# Patient Record
Sex: Male | Born: 1985 | Race: Black or African American | Hispanic: No | Marital: Single | Smoking: Never smoker
Health system: Southern US, Community
[De-identification: ages and names within clinical notes are randomized; demographics above are authoritative.]

## PROBLEM LIST (undated history)

## (undated) DIAGNOSIS — J329 Chronic sinusitis, unspecified: Secondary | ICD-10-CM

## (undated) DIAGNOSIS — F419 Anxiety disorder, unspecified: Secondary | ICD-10-CM

---

## 2010-02-24 ENCOUNTER — Emergency Department (HOSPITAL_COMMUNITY)
Admission: EM | Admit: 2010-02-24 | Discharge: 2010-02-24 | Payer: Self-pay | Source: Home / Self Care | Admitting: Emergency Medicine

## 2011-04-26 ENCOUNTER — Encounter (HOSPITAL_COMMUNITY): Payer: Self-pay | Admitting: Emergency Medicine

## 2011-04-26 ENCOUNTER — Emergency Department (HOSPITAL_COMMUNITY)
Admission: EM | Admit: 2011-04-26 | Discharge: 2011-04-26 | Disposition: A | Discharge status: Home / Self Care | Payer: Self-pay | Attending: Emergency Medicine | Admitting: Emergency Medicine

## 2011-04-26 DIAGNOSIS — J029 Acute pharyngitis, unspecified: Secondary | ICD-10-CM | POA: Insufficient documentation

## 2011-04-26 LAB — RAPID STREP SCREEN (MED CTR MEBANE ONLY): Streptococcus, Group A Screen (Direct): NEGATIVE

## 2011-04-26 MED ORDER — AMOXICILLIN 500 MG PO CAPS: 500.0000 mg | ORAL_CAPSULE | Freq: Three times a day (TID) | ORAL | Status: AC

## 2011-04-26 NOTE — ED Provider Notes (Signed)
History     CSN: [id number]  Arrival date & time 04/26/11  1311   First MD Initiated Contact with Patient 04/26/11 1340      Chief Complaint  Patient presents with  . Oral Swelling    (Consider location/radiation/quality/duration/timing/severity/associated sxs/prior treatment) HPI History from patient. He states that he woke up this morning and felt as if his throat was swelling. He states that when he was eating breakfast, he felt as if he was choking and tried to make himself vomit. He is handling his secretions normally and is not in respiratory distress. Denies fever, chills, nausea, vomiting. Not had chest pain, shortness of breath, abdominal pain. No recent cough. He states that he has had a small amount of congestion, but states that he has allergic rhinitis and this is normal for him.  States he has had oral sex recently but partner does not have any known STIs.  History reviewed. No pertinent past medical history.  History reviewed. No pertinent past surgical history.  History reviewed. No pertinent family history.  History  Substance Use Topics  . Smoking status: Not on file  . Smokeless tobacco: Not on file  . Alcohol Use: 1.2 oz/week    2 Cans of beer per week     per week      Review of Systems as per history of present illness  Allergies  Review of patient's allergies indicates no known allergies.  Home Medications  No current outpatient prescriptions on file.  BP 135/68  Pulse 93  Temp(Src) 98.5 F (36.9 C) (Oral)  Resp 20  SpO2 95%  Physical Exam  Nursing note and vitals reviewed. Constitutional: He appears well-developed and well-nourished. No distress.  HENT:  Head: Normocephalic and atraumatic.  Nose: Nose normal.  Mouth/Throat: No oropharyngeal exudate.       Uvula mildly edematous without deviation. Tonsils 3+ bilaterally. Several small vesicles seen on the palate and one vesicle seen on R tonsil; no tonsillar exudate. No evidence for  peritonsillar abscess.  Eyes: Conjunctivae and EOM are normal. Pupils are equal, round, and reactive to light.  Neck: Normal range of motion. Neck supple.  Cardiovascular: Normal rate, regular rhythm and normal heart sounds.   Pulmonary/Chest: Effort normal and breath sounds normal.  Abdominal: Soft. There is no tenderness.  Lymphadenopathy:    He has no cervical adenopathy.  Neurological: He is alert.  Skin: Skin is warm and dry. He is not diaphoretic.       No rash noted to body, hands, feet  Psychiatric: He has a normal mood and affect.    ED Course  Procedures (including critical care time)   Labs Reviewed  RAPID STREP SCREEN   No results found.   1. Pharyngitis       MDM  Patient presents with pharyngitis and a sensation as if his throat is swelling. There is no evidence of this on exam. He does not have any stridor, is handling his secretions normally, and is protecting his airway. He is nontoxic-appearing and afebrile. No appreciable adenopathy on exam. No evidence for peritonsillar abscess although uvula appears mildly edematous. Rapid strep negative. It is possible this is a viral pharyngitis, but given his symptoms, will symptomatically treat. Discussed reasons to return to the ED. He verbalized understanding and agreed to plan.       Grant Fontana, [place] 04/26/11 1601

## 2011-04-26 NOTE — ED Notes (Signed)
Pt. Stated, i woke this am and my throat felt swollen and I have a little sore throat, . Pt. Can swallowing ok

## 2011-04-26 NOTE — Discharge Instructions (Signed)
Although your strep test was negative, we have treated you given your symptoms. Please take ALL of the antibiotic until it is gone. If you develop worsening swelling, trouble swallowing, high fever not controlled by medication, or any other worrisome symptoms, return to the ER.  Pharyngitis, Viral and Bacterial Pharyngitis is soreness (inflammation) or infection of the pharynx. It is also called a sore throat. CAUSES  Most sore throats are caused by viruses and are part of a cold. However, some sore throats are caused by strep and other bacteria. Sore throats can also be caused by post nasal drip from draining sinuses, allergies and sometimes from sleeping with an open mouth. Infectious sore throats can be spread from person to person by coughing, sneezing and sharing cups or eating utensils. TREATMENT  Sore throats that are viral usually last 3-4 days. Viral illness will get better without medications (antibiotics). Strep throat and other bacterial infections will usually begin to get better about 24-48 hours after you begin to take antibiotics. HOME CARE INSTRUCTIONS   If the caregiver feels there is a bacterial infection or if there is a positive strep test, they will prescribe an antibiotic. The full course of antibiotics must be taken. If the full course of antibiotic is not taken, you or your child may become ill again. If you or your child has strep throat and do not finish all of the medication, serious heart or kidney diseases may develop.   Drink enough water and fluids to keep your urine clear or pale yellow.   Only take over-the-counter or prescription medicines for pain, discomfort or fever as directed by your caregiver.   Get lots of rest.   Gargle with salt water ( tsp. of salt in a glass of water) as often as every 1-2 hours as you need for comfort.   Hard candies may soothe the throat if individual is not at risk for choking. Throat sprays or lozenges may also be used.  SEEK  MEDICAL CARE IF:   Large, tender lumps in the neck develop.   A rash develops.   Green, yellow-brown or bloody sputum is coughed up.   Your baby is older than 3 months with a rectal temperature of 100.5 F (38.1 C) or higher for more than 1 day.  SEEK IMMEDIATE MEDICAL CARE IF:   A stiff neck develops.   You or your child are drooling or unable to swallow liquids.   You or your child are vomiting, unable to keep medications or liquids down.   You or your child has severe pain, unrelieved with recommended medications.   You or your child are having difficulty breathing (not due to stuffy nose).   You or your child are unable to fully open your mouth.   You or your child develop redness, swelling, or severe pain anywhere on the neck.   You have a fever.   Your baby is older than 3 months with a rectal temperature of 102 F (38.9 C) or higher.   Your baby is 43 months old or younger with a rectal temperature of 100.4 F (38 C) or higher.  MAKE SURE YOU:   Understand these instructions.   Will watch your condition.   Will get help right away if you are not doing well or get worse.  Document Released: 01/13/2005 Document Revised: 01/02/2011 Document Reviewed: 04/12/2007 [facility] Patient Information 2012 [place], [place].

## 2011-05-02 NOTE — ED Provider Notes (Signed)
Medical screening examination/treatment/procedure(s) were performed by non-physician practitioner and as supervising physician I was immediately available for consultation/collaboration.   Gavin Pound. Oletta Lamas, MD 05/02/11 2018

## 2011-11-27 ENCOUNTER — Emergency Department (HOSPITAL_COMMUNITY)
Admission: EM | Admit: 2011-11-27 | Discharge: 2011-11-27 | Disposition: A | Discharge status: Home / Self Care | Payer: Self-pay | Attending: Emergency Medicine | Admitting: Emergency Medicine

## 2011-11-27 ENCOUNTER — Other Ambulatory Visit: Payer: Self-pay

## 2011-11-27 ENCOUNTER — Encounter (HOSPITAL_COMMUNITY): Payer: Self-pay

## 2011-11-27 DIAGNOSIS — R450 Nervousness: Secondary | ICD-10-CM | POA: Insufficient documentation

## 2011-11-27 DIAGNOSIS — F410 Panic disorder [episodic paroxysmal anxiety] without agoraphobia: Secondary | ICD-10-CM | POA: Insufficient documentation

## 2011-11-27 MED ORDER — IBUPROFEN 800 MG PO TABS: 800.0000 mg | ORAL_TABLET | Freq: Three times a day (TID) | ORAL | Status: DC

## 2011-11-27 MED ORDER — LORAZEPAM 1 MG PO TABS
1.0000 mg | ORAL_TABLET | Freq: Once | ORAL | Status: AC
  Administered 2011-11-27: 1 mg via ORAL
  Filled 2011-11-27: qty 1

## 2011-11-27 NOTE — ED Provider Notes (Signed)
I saw and evaluated the patient, reviewed the resident's note and I agree with the findings and plan. Has been watching horror movies. Now afraid to go to sleep.  Is having panic attacks causing cp and tachycardia.  Anxious with hr in 100s to 120s.   Will give benzo to calm down.  Cheri Guppy, MD 12/05/11 [phone]

## 2011-11-27 NOTE — ED Provider Notes (Signed)
History     CSN: [id number]  Arrival date & time 11/27/11  1721   First MD Initiated Contact with Patient 11/27/11 1733      Chief Complaint  Patient presents with  . Chest Pain  . Anxiety    (Consider location/radiation/quality/duration/timing/severity/associated sxs/prior treatment) HPI Comments: Has had intermittent chest tightness lasting several seconds and episodes that, every 5-10 minutes. He is very anxious. It is no worse when he takes a deep breath, it's sharp, it is substernal. He also has intermittent palpitations and occasional shortness of breath due to chest tightness. He has no shortness of breath or chest tightness right now.  Patient is a 26 y.o. male presenting with chest pain. The history is provided by the patient and the spouse. No language interpreter was used.  Chest Pain The chest pain began 3 - 5 days ago. Duration of episode(s) is 10 seconds. Chest pain occurs frequently. The chest pain is unchanged. The pain is associated with stress. At its most intense, the pain is at 7/10. The pain is currently at 0/10. The severity of the pain is moderate. The quality of the pain is described as tightness. The pain does not radiate. Chest pain is worsened by stress. Primary symptoms include shortness of breath and palpitations. Pertinent negatives for primary symptoms include no cough, no abdominal pain, no nausea, no vomiting and no dizziness.  The palpitations also occurred with shortness of breath. The palpitations did not occur with dizziness.   Pertinent negatives for associated symptoms include no diaphoresis. He tried nothing for the symptoms. Risk factors include no known risk factors.  His past medical history is significant for anxiety/panic attacks.  Pertinent negatives for past medical history include no CAD, no cancer, no diabetes, no hypertension and no MI.  Pertinent negatives for family medical history include: no early MI in family and no sudden death in  family.     History reviewed. No pertinent past medical history.  History reviewed. No pertinent past surgical history.  No family history on file.  History  Substance Use Topics  . Smoking status: Not on file  . Smokeless tobacco: Not on file  . Alcohol Use: 1.2 oz/week    2 Cans of beer per week     per week      Review of Systems  Constitutional: Negative for diaphoresis, activity change and appetite change.  HENT: Negative for sore throat and neck pain.   Eyes: Negative for discharge and visual disturbance.  Respiratory: Positive for shortness of breath. Negative for cough and choking.   Cardiovascular: Positive for chest pain and palpitations. Negative for leg swelling.  Gastrointestinal: Negative for nausea, vomiting, abdominal pain, diarrhea and constipation.  Genitourinary: Negative for dysuria and difficulty urinating.  Musculoskeletal: Negative for back pain and arthralgias.  Skin: Negative for color change and pallor.  Neurological: Negative for dizziness, speech difficulty and light-headedness.  Psychiatric/Behavioral: Negative for behavioral problems and agitation. The patient is nervous/anxious.   All other systems reviewed and are negative.    Allergies  Review of patient's allergies indicates no known allergies.  Home Medications  No current outpatient prescriptions on file.  BP 134/84  Pulse 96  Temp 98.5 F (36.9 C) (Oral)  Resp 18  SpO2 96%  Physical Exam  Constitutional: He appears well-developed. No distress.  HENT:  Head: Normocephalic and atraumatic.  Mouth/Throat: No oropharyngeal exudate.  Eyes: EOM are normal. Pupils are equal, round, and reactive to light. Right eye exhibits no discharge. Left  eye exhibits no discharge.  Neck: Normal range of motion. Neck supple. No JVD present.  Cardiovascular: Regular rhythm and normal heart sounds.  Exam reveals no gallop and no friction rub.   No murmur heard.      Sinus tac  Pulmonary/Chest:  Effort normal and breath sounds normal. No stridor. No respiratory distress. He has no wheezes. He has no rales. He exhibits no tenderness.  Abdominal: Soft. Bowel sounds are normal. There is no tenderness. There is no guarding.  Genitourinary: Penis normal.  Musculoskeletal: Normal range of motion. He exhibits no edema and no tenderness.  Neurological: He is alert. No cranial nerve deficit. He exhibits normal muscle tone.  Skin: Skin is warm and dry. He is not diaphoretic. No erythema. No pallor.  Psychiatric: His behavior is normal. Judgment and thought content normal.       anxious    ED Course  Procedures (including critical care time)  Labs Reviewed - No data to display No results found.   1. Panic attack       Date: 11/27/2011  Rate: 106  Rhythm: normal sinus rhythm  QRS Axis: normal  Intervals: normal  ST/T Wave abnormalities: normal  Conduction Disutrbances: none  Narrative Interpretation: normal, sinus tac  Old EKG Reviewed:none    MDM  5:43 PM a picture consistent with anxiety/panic attack. I considered but doubt pulmonary embolism, ACS or other serious pathology. He has no risk factors, no family history of cardiac sudden death, arrhythmias, blood clots. His EKG is sinus tachycardia but otherwise normal. There is no S1 q 3 T3 or other signs of right heart strain.I gave him 1 mg of oral Ativan which he responded to well. I instructed the patient to avoid unnecessary stressors such as scary movies to avoid further panic attacks. Pt deemed stable for discharge. Return precautions were provided and pt expressed understanding to return to ED if any acute symptoms return. Follow up was instructed which pt also expressed understanding. All questions were answered and pt was in agreement w/ plan.         Warrick Parisian, MD 11/27/11 2351

## 2011-11-27 NOTE — ED Notes (Signed)
Patient presents with intermittent, substernal chest tightness that began 2 days ago. Patient reports he's been feeling anxious since the pain began.  Patient also reporting SOB.

## 2011-11-27 NOTE — ED Notes (Signed)
Pt c/o intermittent CP and anxiety attacks, pt reports he has been very anxious lately and then starts to have CP. Pt reports this hasn't happened to him until he started watching scary movies. Pt in nad, airway stable, pt A&Ox4.

## 2011-11-28 NOTE — ED Provider Notes (Signed)
I saw and evaluated the patient, reviewed the resident's note and I agree with the findings and plan.  Cheri Guppy, MD 11/28/11 [phone]

## 2012-02-15 ENCOUNTER — Emergency Department (HOSPITAL_COMMUNITY)
Admission: EM | Admit: 2012-02-15 | Discharge: 2012-02-15 | Disposition: A | Discharge status: Home / Self Care | Payer: Self-pay | Attending: Emergency Medicine | Admitting: Emergency Medicine

## 2012-02-15 ENCOUNTER — Emergency Department (HOSPITAL_COMMUNITY): Payer: Self-pay

## 2012-02-15 ENCOUNTER — Encounter (HOSPITAL_COMMUNITY): Payer: Self-pay | Admitting: *Deleted

## 2012-02-15 DIAGNOSIS — R000 Tachycardia, unspecified: Secondary | ICD-10-CM | POA: Insufficient documentation

## 2012-02-15 DIAGNOSIS — F1510 Other stimulant abuse, uncomplicated: Secondary | ICD-10-CM | POA: Insufficient documentation

## 2012-02-15 DIAGNOSIS — F1610 Hallucinogen abuse, uncomplicated: Secondary | ICD-10-CM

## 2012-02-15 DIAGNOSIS — I10 Essential (primary) hypertension: Secondary | ICD-10-CM | POA: Insufficient documentation

## 2012-02-15 HISTORY — DX: Anxiety disorder, unspecified: F41.9

## 2012-02-15 LAB — RAPID URINE DRUG SCREEN, HOSP PERFORMED
Amphetamines: POSITIVE — AB
Barbiturates: NOT DETECTED
Benzodiazepines: NOT DETECTED

## 2012-02-15 LAB — BASIC METABOLIC PANEL
BUN: 8 mg/dL (ref 6–23)
Chloride: 97 mEq/L (ref 96–112)
Creatinine, Ser: 0.89 mg/dL (ref 0.50–1.35)
GFR calc non Af Amer: >90 mL/min (ref >90)
Glucose, Bld: 113 mg/dL — ABNORMAL HIGH (ref 70–99)
Potassium: 3.6 mEq/L (ref 3.5–5.1)

## 2012-02-15 LAB — CBC
HCT: 42.9 % (ref 39.0–52.0)
Hemoglobin: 15.3 g/dL (ref 13.0–17.0)
MCHC: 35.7 g/dL (ref 30.0–36.0)
MCV: 91.5 fL (ref 78.0–100.0)
RDW: 12 % (ref 11.5–15.5)

## 2012-02-15 MED ORDER — SODIUM CHLORIDE 0.9 % IV BOLUS (SEPSIS)
1000.0000 mL | Freq: Once | INTRAVENOUS | Status: AC
  Administered 2012-02-15: 1000 mL via INTRAVENOUS

## 2012-02-15 MED ORDER — LORAZEPAM 1 MG PO TABS
1.0000 mg | ORAL_TABLET | Freq: Once | ORAL | Status: AC
  Administered 2012-02-15: 1 mg via ORAL
  Filled 2012-02-15: qty 1

## 2012-02-15 MED ORDER — LORAZEPAM 2 MG/ML IJ SOLN
1.0000 mg | Freq: Once | INTRAMUSCULAR | Status: AC
  Administered 2012-02-15: 1 mg via INTRAVENOUS
  Filled 2012-02-15: qty 1

## 2012-02-15 NOTE — ED Notes (Signed)
[id number]<IO> Expected date:<BR> Expected time:<BR> Means of arrival:<BR> Comments:<BR> Anxiety

## 2012-02-15 NOTE — ED Notes (Signed)
Per EMS pt celebrated birthday yesterday, took [place]ance, anxious refusing to come indoors until early this am, B/P 210/180 and HR130's when EMS arrived, B/P decreased to 141/84 and HR decreased to 109 by the time pt arrived at ED.  Pt reports feeling very dehydrated.

## 2012-02-15 NOTE — ED Provider Notes (Signed)
History     CSN: [id number]  Arrival date & time 02/15/12  1006   First MD Initiated Contact with Patient 02/15/12 1024      Chief Complaint  Patient presents with  . Anxiety  . Hypertension    (Consider location/radiation/quality/duration/timing/severity/associated sxs/prior treatment) HPI Comments: Pt states that he took ectasy last night and he is continuing to fell anxious and it is not getting better:pt states that he has done this before but has never had these side effects:pt denies using any other street drugs  Patient is a 27 y.o. male presenting with anxiety. The history is provided by the patient. No language interpreter was used.  Anxiety This is a new problem. The current episode started yesterday. The problem occurs constantly. The problem has been unchanged. Pertinent negatives include no abdominal pain, chest pain, coughing, nausea, vomiting or weakness. Nothing aggravates the symptoms. He has tried nothing for the symptoms.    Past Medical History  Diagnosis Date  . Anxiety     No past surgical history on file.  No family history on file.  History  Substance Use Topics  . Smoking status: Not on file  . Smokeless tobacco: Not on file  . Alcohol Use: 4.2 oz/week    7 Cans of beer per week     Comment: per week      Review of Systems  Constitutional: Negative.   Respiratory: Negative for cough.   Cardiovascular: Negative for chest pain.  Gastrointestinal: Negative for nausea, vomiting and abdominal pain.  Neurological: Negative for weakness.    Allergies  Review of patient's allergies indicates no known allergies.  Home Medications  No current outpatient prescriptions on file.  BP 128/73  Pulse 118  Temp 98.6 F (37 C) (Oral)  Resp 24  Ht 5\' 11"  (1.803 m)  Wt 230 lb (104.327 kg)  BMI 32.08 kg/m2  SpO2 97%  Physical Exam  Nursing note and vitals reviewed. Constitutional: He is oriented to person, place, and time. He appears  well-developed and well-nourished.  HENT:  Head: Atraumatic.  Eyes: Conjunctivae normal and EOM are normal.  Neck: Neck supple.  Cardiovascular: Normal rate and regular rhythm.   Pulmonary/Chest: Effort normal and breath sounds normal.  Abdominal: Soft. Bowel sounds are normal.  Musculoskeletal: Normal range of motion.  Neurological: He is alert and oriented to person, place, and time. Coordination normal.  Skin: Skin is warm and dry.  Psychiatric: He has a normal mood and affect.    ED Course  Procedures (including critical care time)  Labs Reviewed  BASIC METABOLIC PANEL - Abnormal; Notable for the following:    Sodium 133 (*)     Glucose, Bld 113 (*)     All other components within normal limits  URINE RAPID DRUG SCREEN (HOSP PERFORMED) - Abnormal; Notable for the following:    Amphetamines POSITIVE (*)     All other components within normal limits  CBC   Dg Chest 2 View  02/15/2012  *RADIOLOGY REPORT*  Clinical Data: Tachycardia, shortness of breath.  CHEST - 2 VIEW  Comparison: None.  Findings: Cardiac leads project over the chest.  The heart, mediastinal, and hilar contours are within normal limits. Pulmonary vascularity is normal.  The lungs are clear.  There is no pleural effusion.  The imaged bones and upper abdomen are unremarkable.  IMPRESSION: No acute cardiopulmonary disease.   Original Report Authenticated By: Britta Mccreedy, M.D.      Date: 02/15/2012  Rate: 110  Rhythm:  sinus tachycardia  QRS Axis: normal  Intervals: normal  ST/T Wave abnormalities: normal  Conduction Disutrbances:none  Narrative Interpretation:   Old EKG Reviewed: none available   1. Ecstasy abuse   2. Tachycardia       MDM  Pt is feeling better and tolerating po:pt okay to go home:tachycardia likely continued side effect of the ecstacy        Teressa Lower, NP 02/15/12 [phone]

## 2012-02-16 NOTE — ED Provider Notes (Signed)
Medical screening examination/treatment/procedure(s) were performed by non-physician practitioner and as supervising physician I was immediately available for consultation/collaboration.  Flint Melter, MD 02/16/12 1039

## 2012-05-04 ENCOUNTER — Emergency Department (HOSPITAL_COMMUNITY)
Admission: EM | Admit: 2012-05-04 | Discharge: 2012-05-04 | Disposition: A | Discharge status: Home / Self Care | Payer: Self-pay | Attending: Emergency Medicine | Admitting: Emergency Medicine

## 2012-05-04 ENCOUNTER — Emergency Department (HOSPITAL_COMMUNITY): Payer: Self-pay

## 2012-05-04 ENCOUNTER — Encounter (HOSPITAL_COMMUNITY): Payer: Self-pay | Admitting: Emergency Medicine

## 2012-05-04 DIAGNOSIS — Z8659 Personal history of other mental and behavioral disorders: Secondary | ICD-10-CM | POA: Insufficient documentation

## 2012-05-04 DIAGNOSIS — R059 Cough, unspecified: Secondary | ICD-10-CM | POA: Insufficient documentation

## 2012-05-04 DIAGNOSIS — R0789 Other chest pain: Secondary | ICD-10-CM | POA: Insufficient documentation

## 2012-05-04 DIAGNOSIS — R040 Epistaxis: Secondary | ICD-10-CM | POA: Insufficient documentation

## 2012-05-04 DIAGNOSIS — R05 Cough: Secondary | ICD-10-CM | POA: Insufficient documentation

## 2012-05-04 LAB — CBC
HCT: 43.2 % (ref 39.0–52.0)
Hemoglobin: 15.5 g/dL (ref 13.0–17.0)
MCH: 32 pg (ref 26.0–34.0)
MCHC: 35.9 g/dL (ref 30.0–36.0)
MCV: 89.3 fL (ref 78.0–100.0)
Platelets: 292 10*3/uL (ref 150–400)
RBC: 4.84 MIL/uL (ref 4.22–5.81)
RDW: 11.9 % (ref 11.5–15.5)
WBC: 9.2 10*3/uL (ref 4.0–10.5)

## 2012-05-04 LAB — BASIC METABOLIC PANEL
BUN: 8 mg/dL (ref 6–23)
CO2: 28 mEq/L (ref 19–32)
Calcium: 9.7 mg/dL (ref 8.4–10.5)
Chloride: 98 mEq/L (ref 96–112)
Creatinine, Ser: 0.95 mg/dL (ref 0.50–1.35)
GFR calc Af Amer: >90 mL/min (ref >90)
GFR calc non Af Amer: >90 mL/min (ref >90)
Glucose, Bld: 104 mg/dL — ABNORMAL HIGH (ref 70–99)
Potassium: 3.3 mEq/L — ABNORMAL LOW (ref 3.5–5.1)
Sodium: 136 mEq/L (ref 135–145)

## 2012-05-04 LAB — POCT I-STAT TROPONIN I: Troponin i, poc: 0 ng/mL (ref 0.00–0.08)

## 2012-05-04 MED ORDER — IBUPROFEN 600 MG PO TABS: 600.0000 mg | ORAL_TABLET | Freq: Four times a day (QID) | ORAL | Status: DC | PRN

## 2012-05-04 MED ORDER — IBUPROFEN 200 MG PO TABS
600.0000 mg | ORAL_TABLET | Freq: Once | ORAL | Status: AC
  Administered 2012-05-04: 600 mg via ORAL
  Filled 2012-05-04: qty 3

## 2012-05-04 NOTE — ED Provider Notes (Signed)
History    27yM with multiple complaints.  Chest has felt "tight" for past two weeks. Intermittent. Occasional nonproductive cough. No SOB. No fever or chills. No unusual leg pain or swelling. Symptoms intermittent and has not noticed any appreciable exacerbating or relieving factors. Denies drug use. No significant PMHx.   CSN: [id number]  Arrival date & time 05/04/12  1428   None     Chief Complaint  Patient presents with  . Chest Pain  . Epistaxis    (Consider location/radiation/quality/duration/timing/severity/associated sxs/prior treatment) HPI  Past Medical History  Diagnosis Date  . Anxiety     History reviewed. No pertinent past surgical history.  History reviewed. No pertinent family history.  History  Substance Use Topics  . Smoking status: Not on file  . Smokeless tobacco: Not on file  . Alcohol Use: 4.2 oz/week    7 Cans of beer per week     Comment: per week      Review of Systems  All systems reviewed and negative, other than as noted in HPI.   Allergies  Review of patient's allergies indicates no known allergies.  Home Medications   Current Outpatient Rx  Name  Route  Sig  Dispense  Refill  . Multiple Vitamin (MULTIVITAMIN WITH MINERALS) TABS   Oral   Take 1 tablet by mouth daily.           BP 125/75  Pulse 68  Temp(Src) 99.4 F (37.4 C) (Oral)  Resp 16  SpO2 98%  Physical Exam  Nursing note and vitals reviewed. Constitutional: He appears well-developed and well-nourished. No distress.  HENT:  Head: Normocephalic and atraumatic.  Small amount of dried blood noted r nostril  Eyes: Conjunctivae are normal. Right eye exhibits no discharge. Left eye exhibits no discharge.  Neck: Neck supple.  Cardiovascular: Normal rate, regular rhythm and normal heart sounds.  Exam reveals no gallop and no friction rub.   No murmur heard. Pulmonary/Chest: Effort normal and breath sounds normal. No respiratory distress.  Abdominal: Soft. He  exhibits no distension. There is no tenderness.  Musculoskeletal: He exhibits no edema and no tenderness.  Lower extremities symmetric as compared to each other. No calf tenderness. Negative Homan's. No palpable cords.   Neurological: He is alert.  Skin: Skin is warm and dry. He is not diaphoretic.  Psychiatric: He has a normal mood and affect. His behavior is normal. Thought content normal.    ED Course  Procedures (including critical care time)  Labs Reviewed  BASIC METABOLIC PANEL - Abnormal; Notable for the following:    Potassium 3.3 (*)    Glucose, Bld 104 (*)    All other components within normal limits  CBC  POCT I-STAT TROPONIN I   No results found.  Dg Chest 2 View  05/04/2012  *RADIOLOGY REPORT*  Clinical Data: 2-week history of cough and chest tightness.  CHEST - 2 VIEW  Comparison: Two-view chest x-ray 02/15/2012.  Findings: Cardiomediastinal silhouette unremarkable.  Lungs clear. Bronchovascular markings normal.  Pulmonary vascularity normal.  No pleural effusions.  No pneumothorax.  Visualized bony thorax intact.  No significant interval change.  IMPRESSION: Normal and stable examination.   Original Report Authenticated By: Hulan Saas, M.D.    EKG:  Rhythm: normal sinus Vent. rate 66 BPM PR interval 160 ms QRS duration 88 ms QT/QTc 352/369 ms ST segments: normal  1. Chest tightness   2. Epistaxis       MDM  27yM with chest tightness. Atypical for ACS.  Fairly normal appearing EKG. NOnfocal exam. Clear cxr. Doubt SBI, PE, dissection or other urgent/emergent etiology. Epistaxis which spontaneously resolved. Return precautions discussed.         Raeford Razor, MD 05/08/12 1434

## 2012-05-04 NOTE — ED Notes (Signed)
Pt c/o chest tightness and nosebleed today; pt sts some cough x 2 weeks; pt sts tightness with inspiration

## 2012-05-04 NOTE — ED Notes (Signed)
Pt given discharge paperwork.  No additional questions by pt regarding d/c.  VSS.  Resps e/u.  E-signature obtained. Ambulatory on dc. 

## 2012-05-21 ENCOUNTER — Encounter (HOSPITAL_COMMUNITY): Payer: Self-pay | Admitting: *Deleted

## 2012-05-21 DIAGNOSIS — F411 Generalized anxiety disorder: Secondary | ICD-10-CM | POA: Insufficient documentation

## 2012-05-21 DIAGNOSIS — R5381 Other malaise: Secondary | ICD-10-CM | POA: Insufficient documentation

## 2012-05-21 DIAGNOSIS — R0789 Other chest pain: Secondary | ICD-10-CM | POA: Insufficient documentation

## 2012-05-21 DIAGNOSIS — R0989 Other specified symptoms and signs involving the circulatory and respiratory systems: Secondary | ICD-10-CM | POA: Insufficient documentation

## 2012-05-21 DIAGNOSIS — J45909 Unspecified asthma, uncomplicated: Secondary | ICD-10-CM | POA: Insufficient documentation

## 2012-05-21 LAB — CBC
MCH: 32.6 pg (ref 26.0–34.0)
MCV: 91.4 fL (ref 78.0–100.0)
Platelets: 278 10*3/uL (ref 150–400)
RBC: 4.54 MIL/uL (ref 4.22–5.81)
RDW: 12 % (ref 11.5–15.5)

## 2012-05-21 LAB — BASIC METABOLIC PANEL
Calcium: 9.8 mg/dL (ref 8.4–10.5)
Creatinine, Ser: 0.96 mg/dL (ref 0.50–1.35)
GFR calc Af Amer: >90 mL/min (ref >90)
GFR calc non Af Amer: >90 mL/min (ref >90)
Sodium: 138 mEq/L (ref 135–145)

## 2012-05-21 LAB — POCT I-STAT TROPONIN I: Troponin i, poc: 0 ng/mL (ref 0.00–0.08)

## 2012-05-21 NOTE — ED Notes (Signed)
Pt c/o weakness and chest tightness since 5:30 PM tonight, states he ate a sandwich when it started but it didn't go away.  Denies n/v/d, no LOC

## 2012-05-22 ENCOUNTER — Emergency Department (HOSPITAL_COMMUNITY)
Admission: EM | Admit: 2012-05-22 | Discharge: 2012-05-22 | Disposition: A | Discharge status: Home / Self Care | Payer: Self-pay | Attending: Emergency Medicine | Admitting: Emergency Medicine

## 2012-05-22 ENCOUNTER — Emergency Department (HOSPITAL_COMMUNITY): Payer: Self-pay

## 2012-05-22 DIAGNOSIS — J45909 Unspecified asthma, uncomplicated: Secondary | ICD-10-CM

## 2012-05-22 MED ORDER — CETIRIZINE-PSEUDOEPHEDRINE ER 5-120 MG PO TB12: 1.0000 | ORAL_TABLET | Freq: Every day | ORAL | Status: DC

## 2012-05-22 MED ORDER — ALBUTEROL SULFATE HFA 108 (90 BASE) MCG/ACT IN AERS
2.0000 | INHALATION_SPRAY | RESPIRATORY_TRACT | Status: DC | PRN
  Administered 2012-05-22: 2 via RESPIRATORY_TRACT
  Filled 2012-05-22: qty 6.7

## 2012-05-22 MED ORDER — ALBUTEROL SULFATE (5 MG/ML) 0.5% IN NEBU
5.0000 mg | INHALATION_SOLUTION | Freq: Once | RESPIRATORY_TRACT | Status: AC
  Administered 2012-05-22: 5 mg via RESPIRATORY_TRACT
  Filled 2012-05-22: qty 6

## 2012-05-22 MED ORDER — IBUPROFEN 800 MG PO TABS
800.0000 mg | ORAL_TABLET | Freq: Once | ORAL | Status: AC
Start: 2012-05-22 — End: 2012-05-22
  Administered 2012-05-22: 800 mg via ORAL
  Filled 2012-05-22: qty 1

## 2012-05-22 MED ORDER — LORATADINE 10 MG PO TABS
10.0000 mg | ORAL_TABLET | Freq: Once | ORAL | Status: AC
  Administered 2012-05-22: 10 mg via ORAL
  Filled 2012-05-22: qty 1

## 2012-05-22 MED ORDER — ALBUTEROL SULFATE HFA 108 (90 BASE) MCG/ACT IN AERS: 1.0000 | INHALATION_SPRAY | Freq: Four times a day (QID) | RESPIRATORY_TRACT | Status: AC | PRN

## 2012-05-22 MED ORDER — PREDNISONE 20 MG PO TABS: 60.0000 mg | ORAL_TABLET | Freq: Every day | ORAL | Status: DC

## 2012-05-22 NOTE — ED Provider Notes (Signed)
History     CSN: [id number]  Arrival date & time 05/21/12  2242   None     Chief Complaint  Patient presents with  . Chest Pain    (Consider location/radiation/quality/duration/timing/severity/associated sxs/prior treatment) HPI Hx per PT - at home tonight developed some sharp chest pain across his chest that he describes also as tightness and some associated generalized weakness. Symptoms persisted so he presents here for evaluation. Pain is worse with deep inspiration he denies any shortness of breath. No cough or recent illness. He has had some congestion and "sinus problems" that he attributes to allergies. He does not take anything for this. He was evaluated here a few weeks ago for the same chest pain and was told that he has pleurisy. He denies any tobacco use or smoking. He does not take any medications. No recent travel. No recent illness otherwise. No rash. No tick bites. No fevers. Symptoms moderate in severity. Past Medical History  Diagnosis Date  . Anxiety     History reviewed. No pertinent past surgical history.  History reviewed. No pertinent family history.  History  Substance Use Topics  . Smoking status: Never Smoker   . Smokeless tobacco: Not on file  . Alcohol Use: 4.2 oz/week    7 Cans of beer per week     Comment: per week      Review of Systems  Constitutional: Negative for fever and chills.  HENT: Positive for congestion. Negative for neck pain and neck stiffness.   Eyes: Negative for pain.  Respiratory: Positive for chest tightness. Negative for cough and shortness of breath.   Cardiovascular: Negative for palpitations and leg swelling.  Gastrointestinal: Negative for abdominal pain.  Genitourinary: Negative for dysuria.  Musculoskeletal: Negative for back pain.  Skin: Negative for rash.  Neurological: Negative for headaches.  All other systems reviewed and are negative.    Allergies  Review of patient's allergies indicates no known  allergies.  Home Medications   Current Outpatient Rx  Name  Route  Sig  Dispense  Refill  . Multiple Vitamin (MULTIVITAMIN WITH MINERALS) TABS   Oral   Take 1 tablet by mouth daily.           BP 149/95  Pulse 95  Temp(Src) 98.5 F (36.9 C) (Oral)  Resp 16  SpO2 99%  Physical Exam  Constitutional: He is oriented to person, place, and time. He appears well-developed and well-nourished.  HENT:  Head: Normocephalic and atraumatic.  Nose: Nose normal.  Mouth/Throat: Oropharynx is clear and moist. No oropharyngeal exudate.  Eyes: EOM are normal. Pupils are equal, round, and reactive to light.  Neck: Neck supple.  Cardiovascular: Normal rate, regular rhythm and intact distal pulses.   Pulmonary/Chest: Effort normal. No stridor. No respiratory distress.  mildly dec bilateral breath sounds, no wheezes or resp distress  Abdominal: Soft. Bowel sounds are normal. He exhibits no distension. There is no rebound.  Musculoskeletal: Normal range of motion. He exhibits no edema.  Lymphadenopathy:    He has no cervical adenopathy.  Neurological: He is alert and oriented to person, place, and time.  Skin: Skin is warm and dry.    ED Course  Procedures (including critical care time)  Results for orders placed during the hospital encounter of 05/22/12  CBC      Result Value Range   WBC 8.6  4.0 - 10.5 K/uL   RBC 4.54  4.22 - 5.81 MIL/uL   Hemoglobin 14.8  13.0 - 17.0 g/dL  HCT 41.5  39.0 - 52.0 %   MCV 91.4  78.0 - 100.0 fL   MCH 32.6  26.0 - 34.0 pg   MCHC 35.7  30.0 - 36.0 g/dL   RDW 40.9  81.1 - 91.4 %   Platelets 278  150 - 400 K/uL  BASIC METABOLIC PANEL      Result Value Range   Sodium 138  135 - 145 mEq/L   Potassium 3.5  3.5 - 5.1 mEq/L   Chloride 101  96 - 112 mEq/L   CO2 29  19 - 32 mEq/L   Glucose, Bld 111 (*) 70 - 99 mg/dL   BUN 8  6 - 23 mg/dL   Creatinine, Ser 7.82  0.50 - 1.35 mg/dL   Calcium 9.8  8.4 - 95.6 mg/dL   GFR calc non Af Amer >90  >90 mL/min    GFR calc Af Amer >90  >90 mL/min  POCT I-STAT TROPONIN I      Result Value Range   Troponin i, poc 0.00  0.00 - 0.08 ng/mL   Comment 3            Dg Chest 2 View  05/22/2012  *RADIOLOGY REPORT*  Clinical Data: Chest pain on deep inspiration.  CHEST - 2 VIEW  Comparison: 05/04/2012  Findings: The heart size and pulmonary vascularity are normal. The lungs appear clear and expanded without focal air space disease or consolidation. No blunting of the costophrenic angles.  No pneumothorax.  Mediastinal contours appear intact.  Stable appearance since previous study.  IMPRESSION: No evidence of active pulmonary disease.   Original Report Authenticated By: Burman Nieves, M.D.    Dg Chest 2 View  05/04/2012  *RADIOLOGY REPORT*  Clinical Data: 2-week history of cough and chest tightness.  CHEST - 2 VIEW  Comparison: Two-view chest x-ray 02/15/2012.  Findings: Cardiomediastinal silhouette unremarkable.  Lungs clear. Bronchovascular markings normal.  Pulmonary vascularity normal.  No pleural effusions.  No pneumothorax.  Visualized bony thorax intact.  No significant interval change.  IMPRESSION: Normal and stable examination.   Original Report Authenticated By: Hulan Saas, M.D.        Date: 05/22/2012  Rate: 71  Rhythm: normal sinus rhythm  QRS Axis: normal  Intervals: normal  ST/T Wave abnormalities: nonspecific ST changes  Conduction Disutrbances:none  Narrative Interpretation:   Old EKG Reviewed: unchanged  2:33 AM feels better after albuterol treatment - moving good air on recheck exam. Plan discharge home with prescription for prednisone, albuterol and Zyrtec. Outpatient referral provided. Return precautions verbalized as understood.  MDM  Chest tightness and allergy symptoms - improved with Claritin, Motrin and albuterol  Patient evaluated with EKG, chest x-ray and labs reviewed as above  Old records, vital signs and nursing notes reviewed        Sunnie Nielsen, MD 05/22/12  [phone]

## 2012-05-22 NOTE — ED Notes (Signed)
Pt sts he was washing dished and had an episode of SOB, and chest tightness. Says the chest tightness is worse with deep inspiration. sts it has been intermittent since this evening. Pt has had similar episode last month and came to the ED and was discharged.

## 2012-07-09 ENCOUNTER — Encounter (HOSPITAL_COMMUNITY): Payer: Self-pay

## 2012-07-09 ENCOUNTER — Emergency Department (HOSPITAL_COMMUNITY)
Admission: EM | Admit: 2012-07-09 | Discharge: 2012-07-09 | Disposition: A | Discharge status: Home / Self Care | Payer: Self-pay | Attending: Emergency Medicine | Admitting: Emergency Medicine

## 2012-07-09 DIAGNOSIS — Z79899 Other long term (current) drug therapy: Secondary | ICD-10-CM | POA: Insufficient documentation

## 2012-07-09 DIAGNOSIS — R42 Dizziness and giddiness: Secondary | ICD-10-CM | POA: Insufficient documentation

## 2012-07-09 DIAGNOSIS — F410 Panic disorder [episodic paroxysmal anxiety] without agoraphobia: Secondary | ICD-10-CM | POA: Insufficient documentation

## 2012-07-09 MED ORDER — HYDROXYZINE HCL 25 MG PO TABS: ORAL_TABLET | ORAL | Status: DC

## 2012-07-09 NOTE — ED Notes (Signed)
Pt in by EMS and reports waking up feeling weak, dizzy and his heart beating fast with a headache. Pt denies n/v. Pt calm and alert. No hx of anxiety.

## 2012-07-09 NOTE — ED Notes (Signed)
Per EMS, pt has history of anxiety-was playing video game when he started experiencing anxiety-currently not taking meds-wants meds

## 2012-07-09 NOTE — ED Notes (Signed)
Pt reports chest tightness at the central chest area. Denies SOB at this time.

## 2012-07-09 NOTE — ED Provider Notes (Signed)
History    This chart was scribed for Eugene Oliver, non-physician practitioner working with Vida Roller, MD by Leone Payor, ED Scribe. This patient was seen in room WTR8/WTR8 and the patient's care was started at 1908.   CSN: [id number]  Arrival date & time 07/09/12  6295   First MD Initiated Contact with Patient 07/09/12 2032      Chief Complaint  Patient presents with  . Anxiety     The history is provided by the patient. No language interpreter was used.   HPI Comments: Eugene Oliver is a 27 y.o. male brought in by ambulance, who presents to the Emergency Department complaining of a new episode of anxiety that started today upon waking up. Reports these episodes have been occuring for 2-3 years ago and recently have become more frequent as well as more severe. States he was feeling weak, dizzy, heart palpitations, and HA. States he had 1 episodes earlier this week and 1 episode today. Per pt, he was unable to control the feelings lately which is why he came to the ED. He is currently feeling anxious. He denies any current chest pain, SOB, nausea, vomiting, difficulty swallowing, blurry vision. He does not take any regular daily medications other than a vitamin supplement. Pt is an occasional alcohol user but denies smoking. He denies marijuana use.    Past Medical History  Diagnosis Date  . Anxiety     History reviewed. No pertinent past surgical history.  History reviewed. No pertinent family history.  History  Substance Use Topics  . Smoking status: Never Smoker   . Smokeless tobacco: Not on file  . Alcohol Use: 4.2 oz/week    7 Cans of beer per week     Comment: per week      Review of Systems  Constitutional: Negative for chills.  HENT: Negative for neck pain and neck stiffness.   Eyes: Negative for visual disturbance.  Respiratory: Negative for shortness of breath.   Cardiovascular: Negative for chest pain.  Gastrointestinal: Negative for nausea and  vomiting.  Neurological: Negative for dizziness, weakness and numbness.  All other systems reviewed and are negative.    Allergies  Review of patient's allergies indicates no known allergies.  Home Medications   Current Outpatient Rx  Name  Route  Sig  Dispense  Refill  . albuterol (PROVENTIL HFA;VENTOLIN HFA) 108 (90 BASE) MCG/ACT inhaler   Inhalation   Inhale 1-2 puffs into the lungs every 6 (six) hours as needed for wheezing.   1 Inhaler   0   . Multiple Vitamin (MULTIVITAMIN WITH MINERALS) TABS   Oral   Take 1 tablet by mouth daily.         . hydrOXYzine (ATARAX/VISTARIL) 25 MG tablet      Take 1 tablet PO as when needed for anxiety   10 tablet   0     BP 129/69  Pulse 101  Temp(Src) 99.2 F (37.3 C)  Resp 20  Ht 5\' 10"  (1.778 m)  Wt 267 lb 12.8 oz (121.473 kg)  BMI 38.43 kg/m2  SpO2 96%  Physical Exam  Nursing note and vitals reviewed. Constitutional: He is oriented to person, place, and time. He appears well-developed and well-nourished. No distress.  HENT:  Head: Normocephalic and atraumatic.  Eyes: Conjunctivae and EOM are normal. Pupils are equal, round, and reactive to light.  Neck: Normal range of motion. Neck supple.  Cardiovascular: Normal rate, regular rhythm and normal heart sounds.  Exam reveals no  friction rub.   No murmur heard. Pulses:      Radial pulses are 2+ on the right side, and 2+ on the left side.       Dorsalis pedis pulses are 2+ on the right side, and 2+ on the left side.  Pulmonary/Chest: Effort normal and breath sounds normal. No respiratory distress. He has no wheezes. He has no rales. He exhibits no tenderness.  Musculoskeletal: Normal range of motion.  Neurological: He is alert and oriented to person, place, and time.  Skin: Skin is warm and dry. He is not diaphoretic.  Psychiatric: He has a normal mood and affect. His behavior is normal.    ED Course  Procedures (including critical care time)  DIAGNOSTIC  STUDIES: Oxygen Saturation is 96% on RA, adequate by my interpretation.    COORDINATION OF CARE: 9:08 PM Discussed treatment plan with pt at bedside and pt agreed to plan.    Date: 07/09/2012  Rate: 89  Rhythm: normal sinus rhythm  QRS Axis: normal  Intervals: normal  ST/T Wave abnormalities: normal  Conduction Disutrbances:none  Narrative Interpretation:   Old EKG Reviewed: none available    Labs Reviewed - No data to display No results found.   1. Panic attack       MDM  I personally performed the services described in this documentation, which was scribed in my presence. The recorded information has been reviewed and is accurate.  Patient presenting with history of anxiety, stated that the attacks have increased in frequency. Stated that he does not take anything for anxiety. Symptoms are the same, no new symptoms - just increased frequency. Denied symptoms upon interview. Patient appeared calm and relaxed. Negative findings on EKG. Suspicion to be panic attack. Patient stable, afebrile. Patient discharged. Discharged patient with vistaril - discussed course with patient. Referred patient to psychology for counseling and further treatment for anxiety. Discussed with patient to monitor when get attacks. Discussed with patient to monitor symptoms and if symptoms are to worsen or change to report back to the ED - strict return instructions given. Patient agreed to plan of care, understood, all questions answered.     [employer], PA-C 07/10/12 0222

## 2012-07-09 NOTE — ED Notes (Signed)
Pt ambulatory to exam room with steady gait. Pt a/o x 4. No acute distress. Skin, warm and dry.

## 2012-07-10 NOTE — ED Provider Notes (Signed)
Medical screening examination/treatment/procedure(s) were performed by non-physician practitioner and as supervising physician I was immediately available for consultation/collaboration.    Vida Roller, MD 07/10/12 [phone]

## 2012-07-30 ENCOUNTER — Encounter (HOSPITAL_COMMUNITY): Payer: Self-pay | Admitting: *Deleted

## 2012-07-30 ENCOUNTER — Emergency Department (HOSPITAL_COMMUNITY)
Admission: EM | Admit: 2012-07-30 | Discharge: 2012-07-30 | Disposition: A | Discharge status: Home / Self Care | Payer: Self-pay | Attending: Emergency Medicine | Admitting: Emergency Medicine

## 2012-07-30 DIAGNOSIS — R110 Nausea: Secondary | ICD-10-CM | POA: Insufficient documentation

## 2012-07-30 DIAGNOSIS — R109 Unspecified abdominal pain: Secondary | ICD-10-CM | POA: Insufficient documentation

## 2012-07-30 DIAGNOSIS — Z79899 Other long term (current) drug therapy: Secondary | ICD-10-CM | POA: Insufficient documentation

## 2012-07-30 DIAGNOSIS — Z8659 Personal history of other mental and behavioral disorders: Secondary | ICD-10-CM | POA: Insufficient documentation

## 2012-07-30 LAB — COMPREHENSIVE METABOLIC PANEL
ALT: 25 U/L (ref 0–53)
AST: 23 U/L (ref 0–37)
Albumin: 4.4 g/dL (ref 3.5–5.2)
Calcium: 9.6 mg/dL (ref 8.4–10.5)
GFR calc Af Amer: >90 mL/min (ref >90)
Glucose, Bld: 127 mg/dL — ABNORMAL HIGH (ref 70–99)
Potassium: 3.8 mEq/L (ref 3.5–5.1)
Sodium: 139 mEq/L (ref 135–145)
Total Protein: 8.2 g/dL (ref 6.0–8.3)

## 2012-07-30 LAB — CBC
MCH: 32.2 pg (ref 26.0–34.0)
MCHC: 34.8 g/dL (ref 30.0–36.0)
Platelets: 275 10*3/uL (ref 150–400)
RDW: 12 % (ref 11.5–15.5)

## 2012-07-30 LAB — URINALYSIS, ROUTINE W REFLEX MICROSCOPIC
Hgb urine dipstick: NEGATIVE
Specific Gravity, Urine: 1.025 (ref 1.005–1.030)
Urobilinogen, UA: 1 mg/dL (ref 0.0–1.0)
pH: 6 (ref 5.0–8.0)

## 2012-07-30 MED ORDER — RANITIDINE HCL 150 MG PO CAPS: 150.0000 mg | ORAL_CAPSULE | Freq: Every day | ORAL | Status: DC

## 2012-07-30 MED ORDER — ONDANSETRON HCL 4 MG PO TABS: 4.0000 mg | ORAL_TABLET | Freq: Four times a day (QID) | ORAL | Status: DC

## 2012-07-30 MED ORDER — ONDANSETRON HCL 4 MG/2ML IJ SOLN
4.0000 mg | Freq: Once | INTRAMUSCULAR | Status: AC
  Administered 2012-07-30: 4 mg via INTRAVENOUS
  Filled 2012-07-30: qty 2

## 2012-07-30 MED ORDER — GI COCKTAIL ~~LOC~~
30.0000 mL | Freq: Once | ORAL | Status: AC
  Administered 2012-07-30: 30 mL via ORAL
  Filled 2012-07-30: qty 30

## 2012-07-30 NOTE — ED Notes (Signed)
[id number]> Expected date:<BR> Expected time:<BR> Means of arrival:<BR> Comments:<BR> ems abdominal pain male

## 2012-07-30 NOTE — ED Provider Notes (Signed)
History    CSN: [id number] Arrival date & time 07/30/12  1631  First MD Initiated Contact with Patient 07/30/12 1640     Chief Complaint  Patient presents with  . Abdominal Pain  . Diarrhea   (Consider location/radiation/quality/duration/timing/severity/associated sxs/prior Treatment) HPI Patient presents with complaint of nausea abdominal pain and loose bowel movements. He states he woke up around noon today and felt nauseated and then developed abdominal pain. He states the pain is worse in the right upper abdomen. He has had no vomiting and has been able to keep down liquids at home. Triage note states he has had diarrhea however when asked about this he denies any watery voluminous stools. He has had no blood in his stools. He has had no fever. He also states that he had pizza and wings and beer last night. He denies any dysuria or other symptoms. He has not had any treatment prior to arrival. Past Medical History  Diagnosis Date  . Anxiety    History reviewed. No pertinent past surgical history. No family history on file. History  Substance Use Topics  . Smoking status: Never Smoker   . Smokeless tobacco: Not on file  . Alcohol Use: 4.2 oz/week    7 Cans of beer per week     Comment: per week    Review of Systems ROS reviewed and all otherwise negative except for mentioned in HPI  Allergies  Review of patient's allergies indicates no known allergies.  Home Medications   Current Outpatient Rx  Name  Route  Sig  Dispense  Refill  . albuterol (PROVENTIL HFA;VENTOLIN HFA) 108 (90 BASE) MCG/ACT inhaler   Inhalation   Inhale 1-2 puffs into the lungs every 6 (six) hours as needed for wheezing.   1 Inhaler   0   . aspirin-acetaminophen-caffeine (EXCEDRIN MIGRAINE) 250-250-65 MG per tablet   Oral   Take 1 tablet by mouth every 6 (six) hours as needed for pain.         Marland Kitchen ondansetron (ZOFRAN) 4 MG tablet   Oral   Take 1 tablet (4 mg total) by mouth every 6 (six)  hours.   12 tablet   0   . ranitidine (ZANTAC) 150 MG capsule   Oral   Take 1 capsule (150 mg total) by mouth daily.   30 capsule   0    BP 135/70  Pulse 56  Temp(Src) 98.4 F (36.9 C) (Oral)  Resp 18  SpO2 98% Vitals reviewed Physical Exam Physical Examination: General appearance - alert, well appearing, and in no distress Mental status - alert, oriented to person, place, and time Eyes - no conjunctival injection, no scleral icterus Mouth - mucous membranes moist, pharynx normal without lesions Chest - clear to auscultation, no wheezes, rales or rhonchi, symmetric air entry Heart - normal rate, regular rhythm, normal S1, S2, no murmurs, rubs, clicks or gallops Abdomen - soft, diffuse mild ttp- somewhat increased in epigastric and right upper quadrant, no gaurding or rebound, nondistended, no masses or organomegaly, nabs Extremities - peripheral pulses normal, no pedal edema, no clubbing or cyanosis Skin - normal coloration and turgor, no rashes Psych- anxious appearing, but cooperative  ED Course  Procedures (including critical care time) Labs Reviewed  COMPREHENSIVE METABOLIC PANEL - Abnormal; Notable for the following:    Glucose, Bld 127 (*)    All other components within normal limits  CBC  LIPASE, BLOOD  URINALYSIS, ROUTINE W REFLEX MICROSCOPIC   No results found. 1.  Abdominal pain   2. Nausea     MDM  Pt presenting with c/o abdominal pain and nausea.  Pt has mild diffuse tenderness- somewhat in creased in epigastric region.  Labs reassuring.  Suspect gastritis due to meal with alcohol last night.  He is overall nontoxic and well hydrated.  Pt started on zantac and given po fluids.  He tolerated fluids by mouth in the ED.  All results d/w patient at bedside.  Given information for primary care doctors.  Discharged with strict return precautions.  Pt agreeable with plan.  Ethelda Chick, MD 07/30/12 [phone]

## 2012-07-30 NOTE — ED Notes (Signed)
Pt tolerating PO fluids but still c/o abd pain. Denies nausea at this time

## 2012-07-30 NOTE — ED Notes (Signed)
Pt given water for po trial

## 2012-07-30 NOTE — ED Notes (Signed)
Per ems: pt from home, reports waking up around 1130 this morning with abd pain, nausea and diarrhea. Reports sharp pain across chest. NSR on monitor. Hx of anxiety. Reports last meal was last night, ate pizza and wings. bp 149/102, pulse 82, respirations 20, saO2 98%

## 2012-11-27 ENCOUNTER — Emergency Department (HOSPITAL_COMMUNITY): Payer: Medicaid - Out of State

## 2012-11-27 ENCOUNTER — Encounter (HOSPITAL_COMMUNITY): Payer: Self-pay | Admitting: Emergency Medicine

## 2012-11-27 ENCOUNTER — Emergency Department (HOSPITAL_COMMUNITY)
Admission: EM | Admit: 2012-11-27 | Discharge: 2012-11-27 | Disposition: A | Discharge status: Home / Self Care | Payer: Medicaid - Out of State | Attending: Emergency Medicine | Admitting: Emergency Medicine

## 2012-11-27 DIAGNOSIS — J029 Acute pharyngitis, unspecified: Secondary | ICD-10-CM | POA: Insufficient documentation

## 2012-11-27 DIAGNOSIS — R509 Fever, unspecified: Secondary | ICD-10-CM | POA: Insufficient documentation

## 2012-11-27 DIAGNOSIS — R079 Chest pain, unspecified: Secondary | ICD-10-CM | POA: Insufficient documentation

## 2012-11-27 DIAGNOSIS — R110 Nausea: Secondary | ICD-10-CM | POA: Insufficient documentation

## 2012-11-27 DIAGNOSIS — Z79899 Other long term (current) drug therapy: Secondary | ICD-10-CM | POA: Insufficient documentation

## 2012-11-27 DIAGNOSIS — J069 Acute upper respiratory infection, unspecified: Secondary | ICD-10-CM | POA: Insufficient documentation

## 2012-11-27 DIAGNOSIS — R1084 Generalized abdominal pain: Secondary | ICD-10-CM | POA: Insufficient documentation

## 2012-11-27 DIAGNOSIS — R5381 Other malaise: Secondary | ICD-10-CM | POA: Insufficient documentation

## 2012-11-27 DIAGNOSIS — Z8659 Personal history of other mental and behavioral disorders: Secondary | ICD-10-CM | POA: Insufficient documentation

## 2012-11-27 DIAGNOSIS — R52 Pain, unspecified: Secondary | ICD-10-CM | POA: Insufficient documentation

## 2012-11-27 HISTORY — DX: Chronic sinusitis, unspecified: J32.9

## 2012-11-27 LAB — URINALYSIS, ROUTINE W REFLEX MICROSCOPIC
Bilirubin Urine: NEGATIVE
Hgb urine dipstick: NEGATIVE
Ketones, ur: NEGATIVE mg/dL
Leukocytes, UA: NEGATIVE
Specific Gravity, Urine: 1.021 (ref 1.005–1.030)
pH: 8.5 — ABNORMAL HIGH (ref 5.0–8.0)

## 2012-11-27 LAB — RAPID STREP SCREEN (MED CTR MEBANE ONLY): Streptococcus, Group A Screen (Direct): NEGATIVE

## 2012-11-27 MED ORDER — ACETAMINOPHEN 325 MG PO TABS
650.0000 mg | ORAL_TABLET | Freq: Once | ORAL | Status: AC
  Administered 2012-11-27: 650 mg via ORAL
  Filled 2012-11-27: qty 2

## 2012-11-27 NOTE — ED Provider Notes (Signed)
CSN: [id number]     Arrival date & time 11/27/12  1835 History   First MD Initiated Contact with Patient 11/27/12 2000     Chief Complaint  Patient presents with  . Cough  . Generalized Body Aches  . Fever    HPI  Eugene Oliver is a 27 y.o. male with a PMH of anxiety and sinusitis who presents to the ED for evaluation of cough, myalgias and fever.  History was provided by the patient.  Patient states that he developed a nonproductive cough today. He also complains of myalgias, rhinorrhea, congestion, subjective fever, chills, generalized abdominal pain, sore throat, and nausea. He denies any rash, diarrhea, constipation, dysuria, headache, neck pain, emesis, or leg edema.  He also complains of some chest pain on his left side with coughing only. No chest pain at rest. He has intermittent SOB but not currently. He has not done anything at home to treat his symptoms. He is in the emergency room with his girlfriend who had similar symptoms. He denies any recent travel. He denies any cardiac or history of clotting. No recent surgeries. He otherwise is healthy and does not use any medications on a daily basis.   Past Medical History  Diagnosis Date  . Anxiety   . Sinusitis    History reviewed. No pertinent past surgical history. No family history on file. History  Substance Use Topics  . Smoking status: Never Smoker   . Smokeless tobacco: Not on file  . Alcohol Use: 4.2 oz/week    7 Cans of beer per week     Comment: per week    Review of Systems  Constitutional: Positive for fever (subjective), chills and fatigue. Negative for activity change and appetite change.  HENT: Positive for congestion, rhinorrhea and sore throat. Negative for drooling, ear pain, hearing loss, sinus pressure, tinnitus, trouble swallowing and voice change.   Eyes: Negative for photophobia and visual disturbance.  Respiratory: Positive for cough. Negative for chest tightness, shortness of breath and wheezing.    Cardiovascular: Positive for chest pain. Negative for palpitations and leg swelling.  Gastrointestinal: Positive for nausea and abdominal pain. Negative for vomiting, diarrhea and constipation.  Genitourinary: Negative for dysuria and hematuria.  Musculoskeletal: Positive for myalgias. Negative for back pain, gait problem, neck pain and neck stiffness.  Skin: Negative for rash and wound.  Neurological: Negative for dizziness, weakness, light-headedness and headaches.    Allergies  Review of patient's allergies indicates no known allergies.  Home Medications   Current Outpatient Rx  Name  Route  Sig  Dispense  Refill  . albuterol (PROVENTIL HFA;VENTOLIN HFA) 108 (90 BASE) MCG/ACT inhaler   Inhalation   Inhale 1-2 puffs into the lungs every 6 (six) hours as needed for wheezing.   1 Inhaler   0   . Pseudoephedrine-Naproxen Na (ALEVE SINUS & HEADACHE) 120-220 MG TB12   Oral   Take 1 tablet by mouth every 6 (six) hours as needed (sinus).          BP 141/92  Pulse 113  Temp(Src) 100.5 F (38.1 C) (Oral)  Resp 20  SpO2 94%  Filed Vitals:   11/27/12 1855 11/27/12 2050 11/27/12 2224  BP: 141/92 136/91 127/75  Pulse: 113 100 82  Temp: 100.5 F (38.1 C) 99.2 F (37.3 C) 99.2 F (37.3 C)  TempSrc: Oral Oral Oral  Resp: 20 16 20   SpO2: 94% 98% 97%    Physical Exam  Nursing note and vitals reviewed. Constitutional: He is  oriented to person, place, and time. He appears well-developed and well-nourished. No distress.  HENT:  Head: Normocephalic and atraumatic.  Right Ear: External ear normal.  Left Ear: External ear normal.  Nose: Nose normal.  Mouth/Throat: Oropharynx is clear and moist. No oropharyngeal exudate.  Uvula midline.  Mild erythema to the posterior pharynx with no exudates.  No trismus.  TM's gray and translucent bilaterally.    Eyes: Conjunctivae and EOM are normal. Pupils are equal, round, and reactive to light. Right eye exhibits no discharge. Left eye  exhibits no discharge.  Neck: Normal range of motion. Neck supple.  No LAD  Cardiovascular: Normal rate, regular rhythm, normal heart sounds and intact distal pulses.  Exam reveals no gallop and no friction rub.   No murmur heard. Pulmonary/Chest: Effort normal and breath sounds normal. No respiratory distress. He has no wheezes. He has no rales. He exhibits tenderness.  Intermittent dry cough.  Tenderness to palpation to the mid-sternal region  Abdominal: Soft. Bowel sounds are normal. He exhibits no distension. There is no tenderness.  Musculoskeletal: Normal range of motion. He exhibits no edema and no tenderness.  No pedal edema or calf tenderness bilaterally  Lymphadenopathy:    He has no cervical adenopathy.  Neurological: He is alert and oriented to person, place, and time.  Skin: Skin is warm and dry. He is not diaphoretic.    ED Course  Procedures (including critical care time) Labs Review Labs Reviewed - No data to display Imaging Review No results found.  EKG Interpretation     Ventricular Rate:  95 PR Interval:  168 QRS Duration: 87 QT Interval:  301 QTC Calculation: 378 R Axis:   -114 Text Interpretation:  Sinus or ectopic atrial tachycardia Ventricular bigeminy Probable left atrial enlargement RSR' in V1 or V2, probably normal variant Inferior infarct, age indeterminate ST elevation, consider anterior injury ED PHYSICIAN INTERPRETATION AVAILABLE IN [facility]           Results for orders placed during the hospital encounter of 11/27/12  RAPID STREP SCREEN      Result Value Range   Streptococcus, Group A Screen (Direct) NEGATIVE  NEGATIVE  URINALYSIS, ROUTINE W REFLEX MICROSCOPIC      Result Value Range   Color, Urine YELLOW  YELLOW   APPearance CLEAR  CLEAR   Specific Gravity, Urine 1.021  1.005 - 1.030   pH 8.5 (*) 5.0 - 8.0   Glucose, UA NEGATIVE  NEGATIVE mg/dL   Hgb urine dipstick NEGATIVE  NEGATIVE   Bilirubin Urine NEGATIVE  NEGATIVE    Ketones, ur NEGATIVE  NEGATIVE mg/dL   Protein, ur NEGATIVE  NEGATIVE mg/dL   Urobilinogen, UA 2.0 (*) 0.0 - 1.0 mg/dL   Nitrite NEGATIVE  NEGATIVE   Leukocytes, UA NEGATIVE  NEGATIVE   DG Chest 2 View (Final result)  Result time: 11/27/12 21:12:55    Final result by Rad Results In Interface (11/27/12 21:12:55)    Narrative:   CLINICAL DATA: Chest pain, shortness of breath, fever, former smoker  EXAM: CHEST 2 VIEW  COMPARISON: 05/22/2012  FINDINGS: Normal heart size, mediastinal contours, and pulmonary vascularity.  Mild peribronchial thickening.  No pulmonary infiltrate, pleural effusion, or pneumothorax.  Bones unremarkable.  IMPRESSION: Mild bronchitic changes.   Electronically Signed By: Ulyses Southward M.D. On: 11/27/2012 21:12    MDM   1. URI (upper respiratory infection)     Eugene Oliver is a 27 y.o. male with a PMH of anxiety and sinusitis who presents  to the ED for evaluation of cough, myalgias and fever.  Tylenol ordered for symptomatic relief. Chest x-ray, rapid strep, and UA ordered.  Fluid challenge.     Rechecks  10:20 PM = Patient informed of results.  Patient resting comfortably.  States he feels better.  Girlfriend in the room.      Etiology of multiple complaints is likely due to an URI.  Chest x-ray negative for an acute cardiopulmonary process and showed mild bronchitic changes. Rapid strep negative. UA negative for UTI and abdominal exam benign.  EKG negative for an acute ischemic changes. Patient had improvements in his symptoms with Tylenol. He remained in no acute distress.  He had a low grade fever which reduced in the ED.  He has a known sick contact who is also present in the ED being evaluated.  Wells Score 0.  He had mild tachycardia initially at triage which resolved with fluids and rest.  He was able to tolerate PO fluids without difficulty.  He declined prescription for an inhaler.  Follow-up instructions and strict return precautions  were given.  Patient in agreement with discharge and plan.     Final impressions: 1. URI     Luiz Iron PA-C   This patient was discussed with Dr. Wenda Low, PA-C 11/29/12 1504

## 2012-11-27 NOTE — ED Notes (Signed)
Bed: WA09 Expected date:  Expected time:  Means of arrival:  Comments: Triage 1 

## 2012-11-27 NOTE — ED Notes (Signed)
Patient given water

## 2012-11-27 NOTE — ED Notes (Signed)
Pt from home via EMS, c/o fever, generalized body aches, non-productive cough since this am. Pt also c/o L chest wall pain. Pt is A&O and in NAD

## 2012-11-30 LAB — CULTURE, GROUP A STREP

## 2012-12-02 NOTE — ED Provider Notes (Signed)
Medical screening examination/treatment/procedure(s) were performed by non-physician practitioner and as supervising physician I was immediately available for consultation/collaboration.  EKG Interpretation     Ventricular Rate:  95 PR Interval:  168 QRS Duration: 87 QT Interval:  301 QTC Calculation: 378 R Axis:   -114 Text Interpretation:  Sinus or ectopic atrial tachycardia Ventricular bigeminy Probable left atrial enlargement RSR' in V1 or V2, probably normal variant Inferior infarct, age indeterminate ST elevation, consider anterior injury ED PHYSICIAN INTERPRETATION AVAILABLE IN [facility] Tacey Ruiz, MD 12/02/12 [phone]

## 2014-01-12 IMAGING — CR DG CHEST 2V
2 series · 2 of 2 positions shown · non-contrast
Comparison: 05/04/2012

CLINICAL DATA: Chest pain on deep inspiration.

CHEST - 2 VIEW

[w chest pa *]
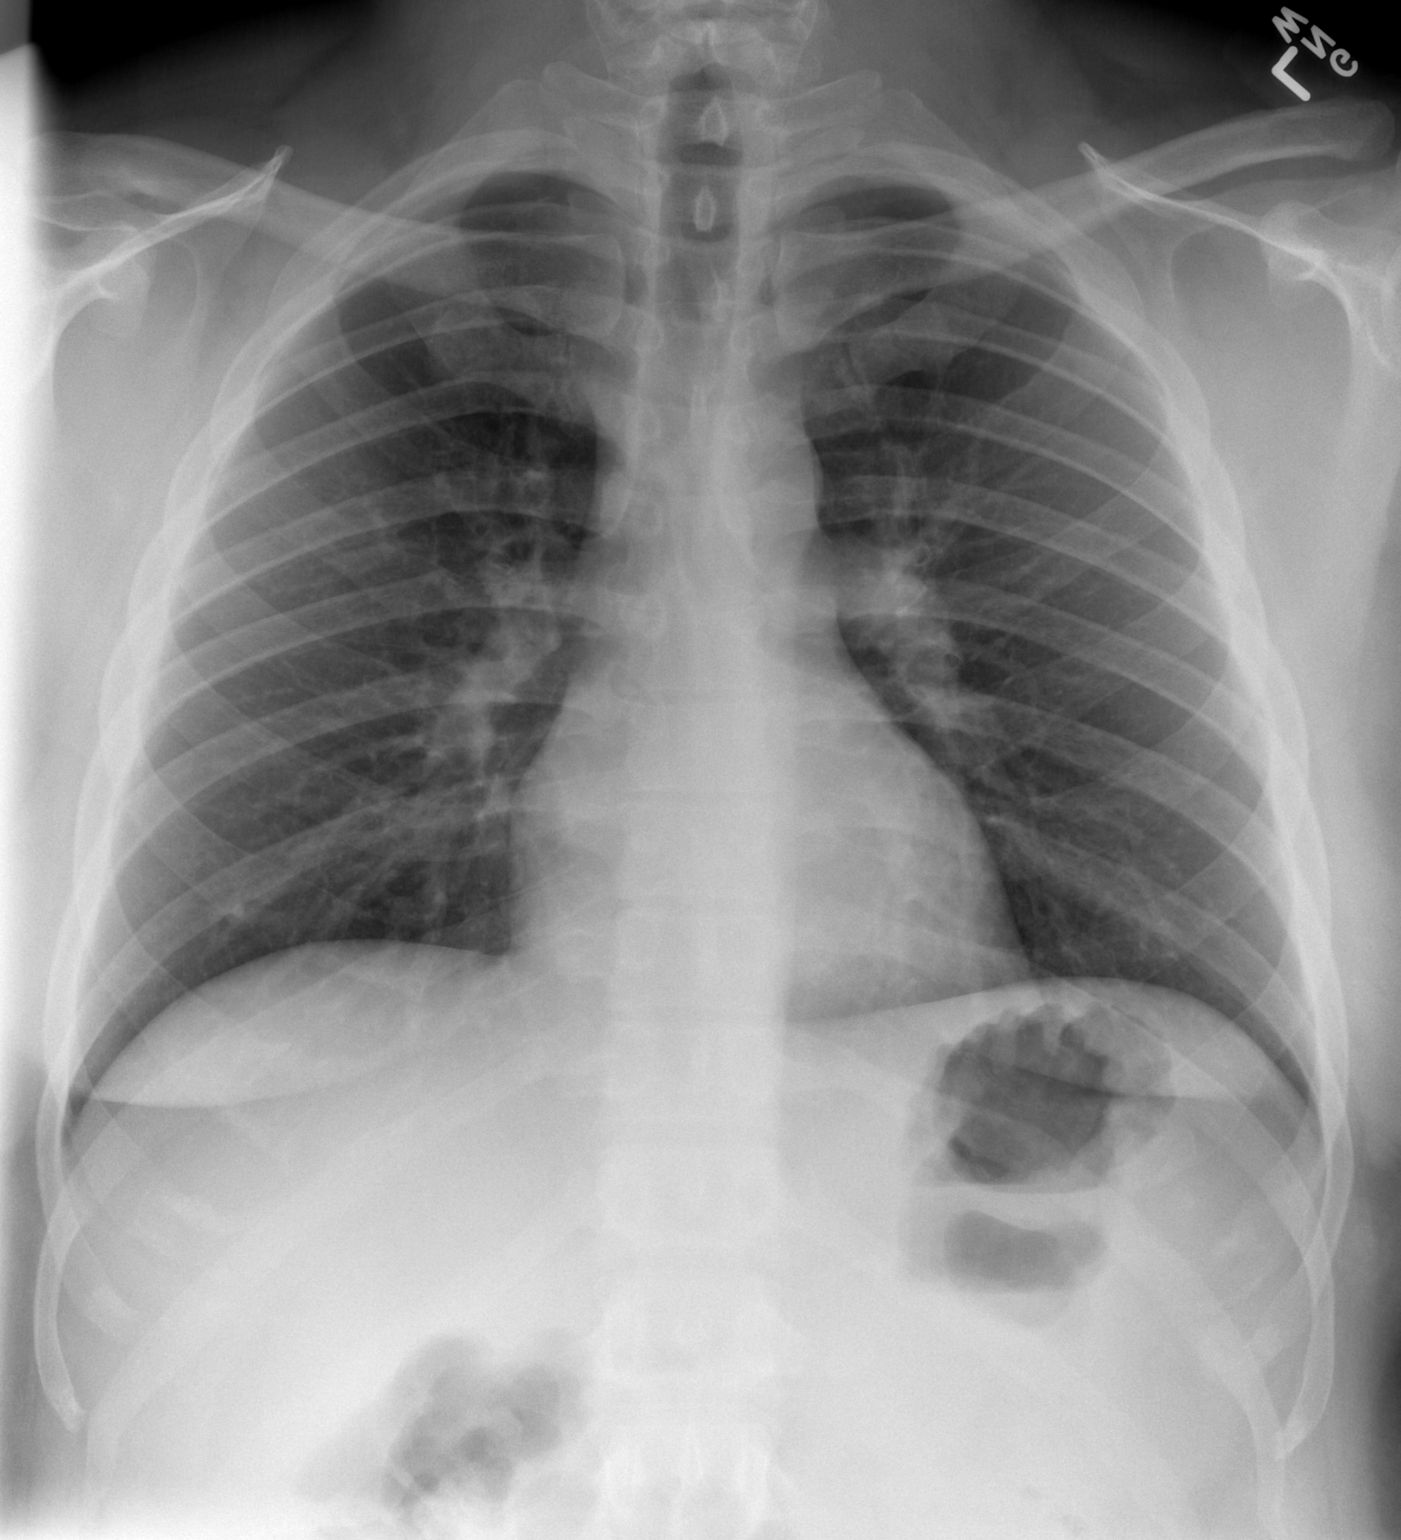

[w chest lat *]
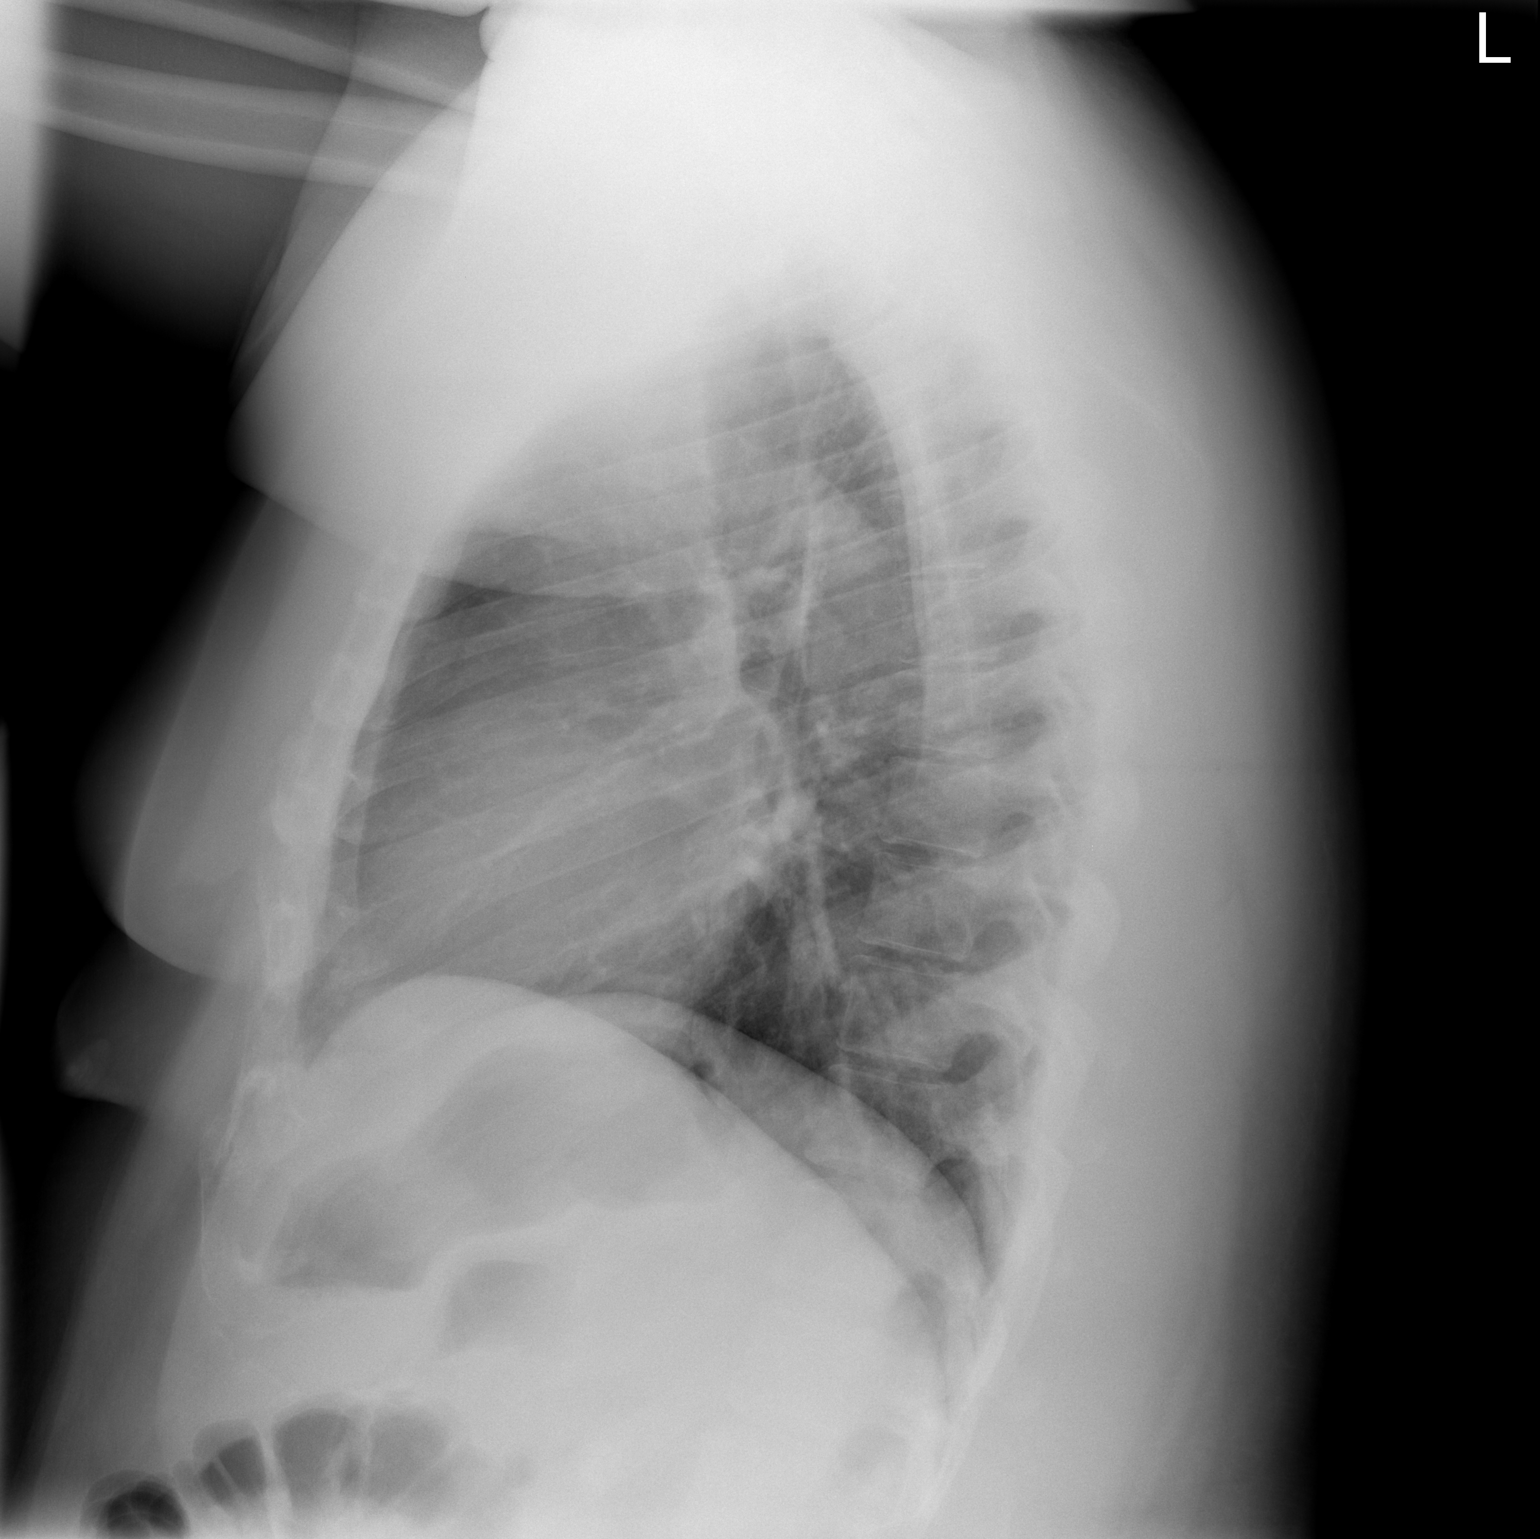

[2 of 2 positions shown; findings below may reference images not displayed]

FINDINGS: The heart size and pulmonary vascularity are normal. The
lungs appear clear and expanded without focal air space disease or
consolidation. No blunting of the costophrenic angles.  No
pneumothorax.  Mediastinal contours appear intact.  Stable
appearance since previous study.
IMPRESSION: No evidence of active pulmonary disease.

## 2014-06-05 ENCOUNTER — Encounter (HOSPITAL_COMMUNITY): Payer: Self-pay | Admitting: Emergency Medicine

## 2014-06-05 ENCOUNTER — Emergency Department (HOSPITAL_COMMUNITY): Payer: Medicaid - Out of State

## 2014-06-05 DIAGNOSIS — R079 Chest pain, unspecified: Secondary | ICD-10-CM | POA: Insufficient documentation

## 2014-06-05 DIAGNOSIS — R05 Cough: Secondary | ICD-10-CM | POA: Insufficient documentation

## 2014-06-05 DIAGNOSIS — R110 Nausea: Secondary | ICD-10-CM | POA: Insufficient documentation

## 2014-06-05 DIAGNOSIS — H9202 Otalgia, left ear: Secondary | ICD-10-CM | POA: Insufficient documentation

## 2014-06-05 LAB — CBC
HCT: 40.4 % (ref 39.0–52.0)
Hemoglobin: 13.7 g/dL (ref 13.0–17.0)
MCH: 31.4 pg (ref 26.0–34.0)
MCHC: 33.9 g/dL (ref 30.0–36.0)
MCV: 92.4 fL (ref 78.0–100.0)
PLATELETS: 166 10*3/uL (ref 150–400)
RBC: 4.37 MIL/uL (ref 4.22–5.81)
RDW: 11.7 % (ref 11.5–15.5)
WBC: 9 10*3/uL (ref 4.0–10.5)

## 2014-06-05 LAB — I-STAT TROPONIN, ED: Troponin i, poc: 0 ng/mL (ref 0.00–0.08)

## 2014-06-05 LAB — BASIC METABOLIC PANEL
Anion gap: 12 (ref 5–15)
BUN: 5 mg/dL — ABNORMAL LOW (ref 6–20)
CO2: 26 mmol/L (ref 22–32)
CREATININE: 0.98 mg/dL (ref 0.61–1.24)
Calcium: 8.9 mg/dL (ref 8.9–10.3)
Chloride: 100 mmol/L — ABNORMAL LOW (ref 101–111)
GFR calc Af Amer: >60 mL/min (ref >60)
GFR calc non Af Amer: >60 mL/min (ref >60)
GLUCOSE: 107 mg/dL — AB (ref 70–99)
Potassium: 3.7 mmol/L (ref 3.5–5.1)
Sodium: 138 mmol/L (ref 135–145)

## 2014-06-05 NOTE — ED Notes (Signed)
Pt. reports left chest pain with SOB , nausea , productive cough and left earache onset this evening .

## 2014-06-06 ENCOUNTER — Emergency Department (HOSPITAL_COMMUNITY)
Admission: EM | Admit: 2014-06-06 | Discharge: 2014-06-06 | Discharge status: Against Medical Advice | Payer: Medicaid - Out of State | Attending: Emergency Medicine | Admitting: Emergency Medicine

## 2014-06-06 ENCOUNTER — Emergency Department (HOSPITAL_COMMUNITY)
Admission: EM | Admit: 2014-06-06 | Discharge: 2014-06-06 | Disposition: A | Discharge status: Home / Self Care | Payer: Medicaid - Out of State | Attending: Emergency Medicine | Admitting: Emergency Medicine

## 2014-06-06 ENCOUNTER — Encounter (HOSPITAL_COMMUNITY): Payer: Self-pay | Admitting: *Deleted

## 2014-06-06 DIAGNOSIS — H66002 Acute suppurative otitis media without spontaneous rupture of ear drum, left ear: Secondary | ICD-10-CM | POA: Diagnosis not present

## 2014-06-06 DIAGNOSIS — H9202 Otalgia, left ear: Secondary | ICD-10-CM | POA: Diagnosis present

## 2014-06-06 DIAGNOSIS — Z8659 Personal history of other mental and behavioral disorders: Secondary | ICD-10-CM | POA: Insufficient documentation

## 2014-06-06 DIAGNOSIS — R079 Chest pain, unspecified: Secondary | ICD-10-CM

## 2014-06-06 DIAGNOSIS — Z79899 Other long term (current) drug therapy: Secondary | ICD-10-CM | POA: Diagnosis not present

## 2014-06-06 DIAGNOSIS — J3489 Other specified disorders of nose and nasal sinuses: Secondary | ICD-10-CM | POA: Insufficient documentation

## 2014-06-06 LAB — BRAIN NATRIURETIC PEPTIDE: B NATRIURETIC PEPTIDE 5: 14.2 pg/mL (ref 0.0–100.0)

## 2014-06-06 MED ORDER — HYDROCODONE-ACETAMINOPHEN 5-325 MG PO TABS
1.0000 | ORAL_TABLET | Freq: Once | ORAL | Status: AC
Start: 2014-06-06 — End: 2014-06-06
  Administered 2014-06-06: 1 via ORAL
  Filled 2014-06-06: qty 1

## 2014-06-06 MED ORDER — AMOXICILLIN 500 MG PO CAPS
1000.0000 mg | ORAL_CAPSULE | Freq: Once | ORAL | Status: AC
  Administered 2014-06-06: 1000 mg via ORAL
  Filled 2014-06-06: qty 2

## 2014-06-06 MED ORDER — AMOXICILLIN 500 MG PO CAPS: 1000.0000 mg | ORAL_CAPSULE | Freq: Two times a day (BID) | ORAL | Status: AC

## 2014-06-06 MED ORDER — IBUPROFEN 800 MG PO TABS: 800.0000 mg | ORAL_TABLET | Freq: Three times a day (TID) | ORAL | Status: AC

## 2014-06-06 NOTE — Discharge Instructions (Signed)

## 2014-06-06 NOTE — ED Notes (Signed)
Pt alert, oriented, and ambulatory upon DC. He was advised to follow up with PCP if not better and to take the full course of antibiotics.

## 2014-06-06 NOTE — ED Provider Notes (Signed)
CSN: [id number]     Arrival date & time 06/06/14  0025 History   First MD Initiated Contact with Patient 06/06/14 0205     Chief Complaint  Patient presents with  . Foreign Body in Ear     (Consider location/radiation/quality/duration/timing/severity/associated sxs/prior Treatment) Patient is a 29 y.o. male presenting with foreign body in ear. The history is provided by the patient. No language interpreter was used.  Foreign Body in Ear This is a new problem. The current episode started today. Associated symptoms include congestion and a fever. Pertinent negatives include no abdominal pain, chills, coughing, myalgias, nausea, rash or vomiting. Associated symptoms comments: He reports sudden onset of sharp, intense left ear pain that started today. He has had fever, congestion and sinus pressure for the past several days. No vomiting, diarrhea, or significant cough..    Past Medical History  Diagnosis Date  . Anxiety   . Sinusitis    History reviewed. No pertinent past surgical history. No family history on file. History  Substance Use Topics  . Smoking status: Never Smoker   . Smokeless tobacco: Not on file  . Alcohol Use: 12.6 oz/week    21 Cans of beer per week     Comment: per week    Review of Systems  Constitutional: Positive for fever. Negative for chills.  HENT: Positive for congestion, ear pain and sinus pressure.   Respiratory: Negative.  Negative for cough.   Cardiovascular: Negative.   Gastrointestinal: Negative.  Negative for nausea, vomiting and abdominal pain.  Musculoskeletal: Negative.  Negative for myalgias.  Skin: Negative.  Negative for rash.  Neurological: Negative.       Allergies  Review of patient's allergies indicates no known allergies.  Home Medications   Prior to Admission medications   Medication Sig Start Date End Date Taking? Authorizing Provider  albuterol (PROVENTIL HFA;VENTOLIN HFA) 108 (90 BASE) MCG/ACT inhaler Inhale 1-2 puffs into  the lungs every 6 (six) hours as needed for wheezing. 05/22/12   Sunnie NielsenBrian Opitz, MD  Pseudoephedrine-Naproxen Na (ALEVE SINUS & HEADACHE) 120-220 MG TB12 Take 1 tablet by mouth every 6 (six) hours as needed (sinus).    Historical Provider, MD   BP 138/71 mmHg  Pulse 92  Temp(Src) 98.7 F (37.1 C) (Oral)  Resp 18  Ht 5\' 10"  (1.778 m)  Wt 268 lb (121.564 kg)  BMI 38.45 kg/m2  SpO2 97% Physical Exam  Constitutional: He is oriented to person, place, and time. He appears well-developed and well-nourished.  HENT:  Right Ear: Tympanic membrane normal. Tympanic membrane is not erythematous and not bulging. No middle ear effusion.  Left Ear: Tympanic membrane is erythematous and bulging. A middle ear effusion is present. No hemotympanum.  Nose: Mucosal edema present.  Mouth/Throat: Mucous membranes are not dry. Posterior oropharyngeal erythema present.  Neck: Normal range of motion.  Pulmonary/Chest: Effort normal.  Musculoskeletal: Normal range of motion.  Neurological: He is alert and oriented to person, place, and time.  Skin: Skin is warm and dry.  Psychiatric: He has a normal mood and affect.    ED Course  Procedures (including critical care time) Labs Review Labs Reviewed - No data to display  Imaging Review Dg Chest 2 View  06/05/2014   CLINICAL DATA:  Left upper chest pain and left ear pain tonight.  EXAM: CHEST  2 VIEW  COMPARISON:  11/27/2012  FINDINGS: The heart size and mediastinal contours are within normal limits. Both lungs are clear. The visualized skeletal structures are unremarkable.  IMPRESSION: No active cardiopulmonary disease.   Electronically Signed   By: Burman NievesWilliam  Stevens M.D.   On: 06/05/2014 23:19     EKG Interpretation None      MDM   Final diagnoses:  None    1. Otitis media, left  URI symptoms with fever for several days,now with abnormal ear and significant pain. Will treat with antibiotics, pain medications and encourage PCP follow up.    Elpidio AnisShari  Yisroel Mullendore, PA-C 06/06/14 [id number]  Marisa Severinlga Otter, MD 06/06/14 [phone]

## 2014-06-06 NOTE — ED Notes (Signed)
Pt states that he feel like something is in his left ear; pt states that he can feel something moving and when it does the pain increases; pt denies drainage from ear; pt denies putting anything in his ear

## 2014-06-06 NOTE — ED Notes (Signed)
[facility] and [facility].

## 2014-06-07 ENCOUNTER — Encounter (HOSPITAL_COMMUNITY): Payer: Self-pay | Admitting: Emergency Medicine

## 2014-06-07 ENCOUNTER — Emergency Department (HOSPITAL_COMMUNITY)
Admission: EM | Admit: 2014-06-07 | Discharge: 2014-06-07 | Discharge status: Against Medical Advice | Payer: Medicaid - Out of State | Attending: Emergency Medicine | Admitting: Emergency Medicine

## 2014-06-07 DIAGNOSIS — F419 Anxiety disorder, unspecified: Secondary | ICD-10-CM | POA: Diagnosis present

## 2014-06-07 NOTE — ED Notes (Signed)
Attempt #1 to call pt.

## 2014-06-07 NOTE — ED Notes (Signed)
Per EMS-was here yesterday for cold symptoms-went out drinking last night and took amoxicillin while he was drinking-having anxiety today

## 2016-01-26 IMAGING — CR DG CHEST 2V
2 series · 2 of 2 positions shown · non-contrast
Comparison: 11/27/2012

CLINICAL DATA: Left upper chest pain and left ear pain tonight.

EXAM:
CHEST  2 VIEW

[chest pa]
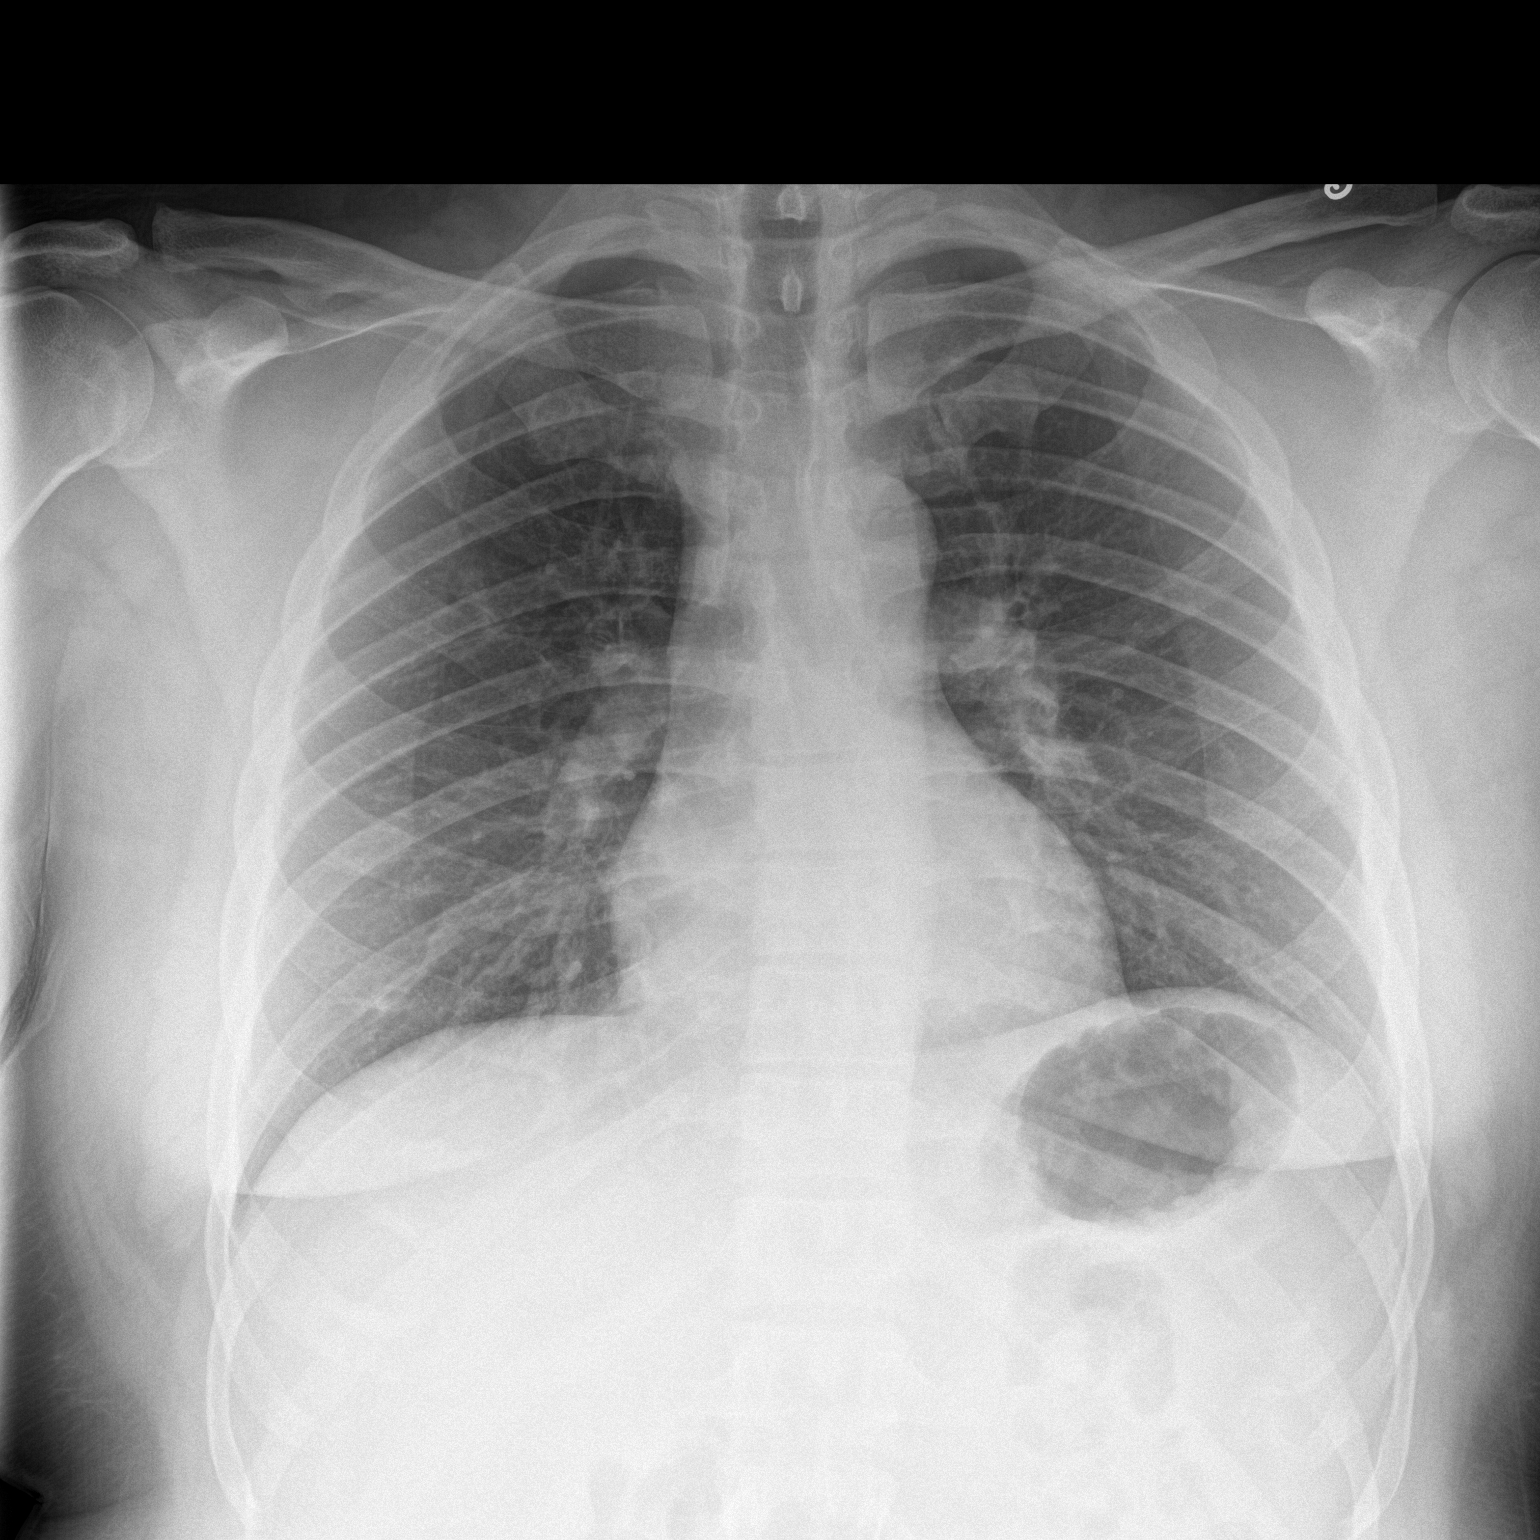

[chest lat]
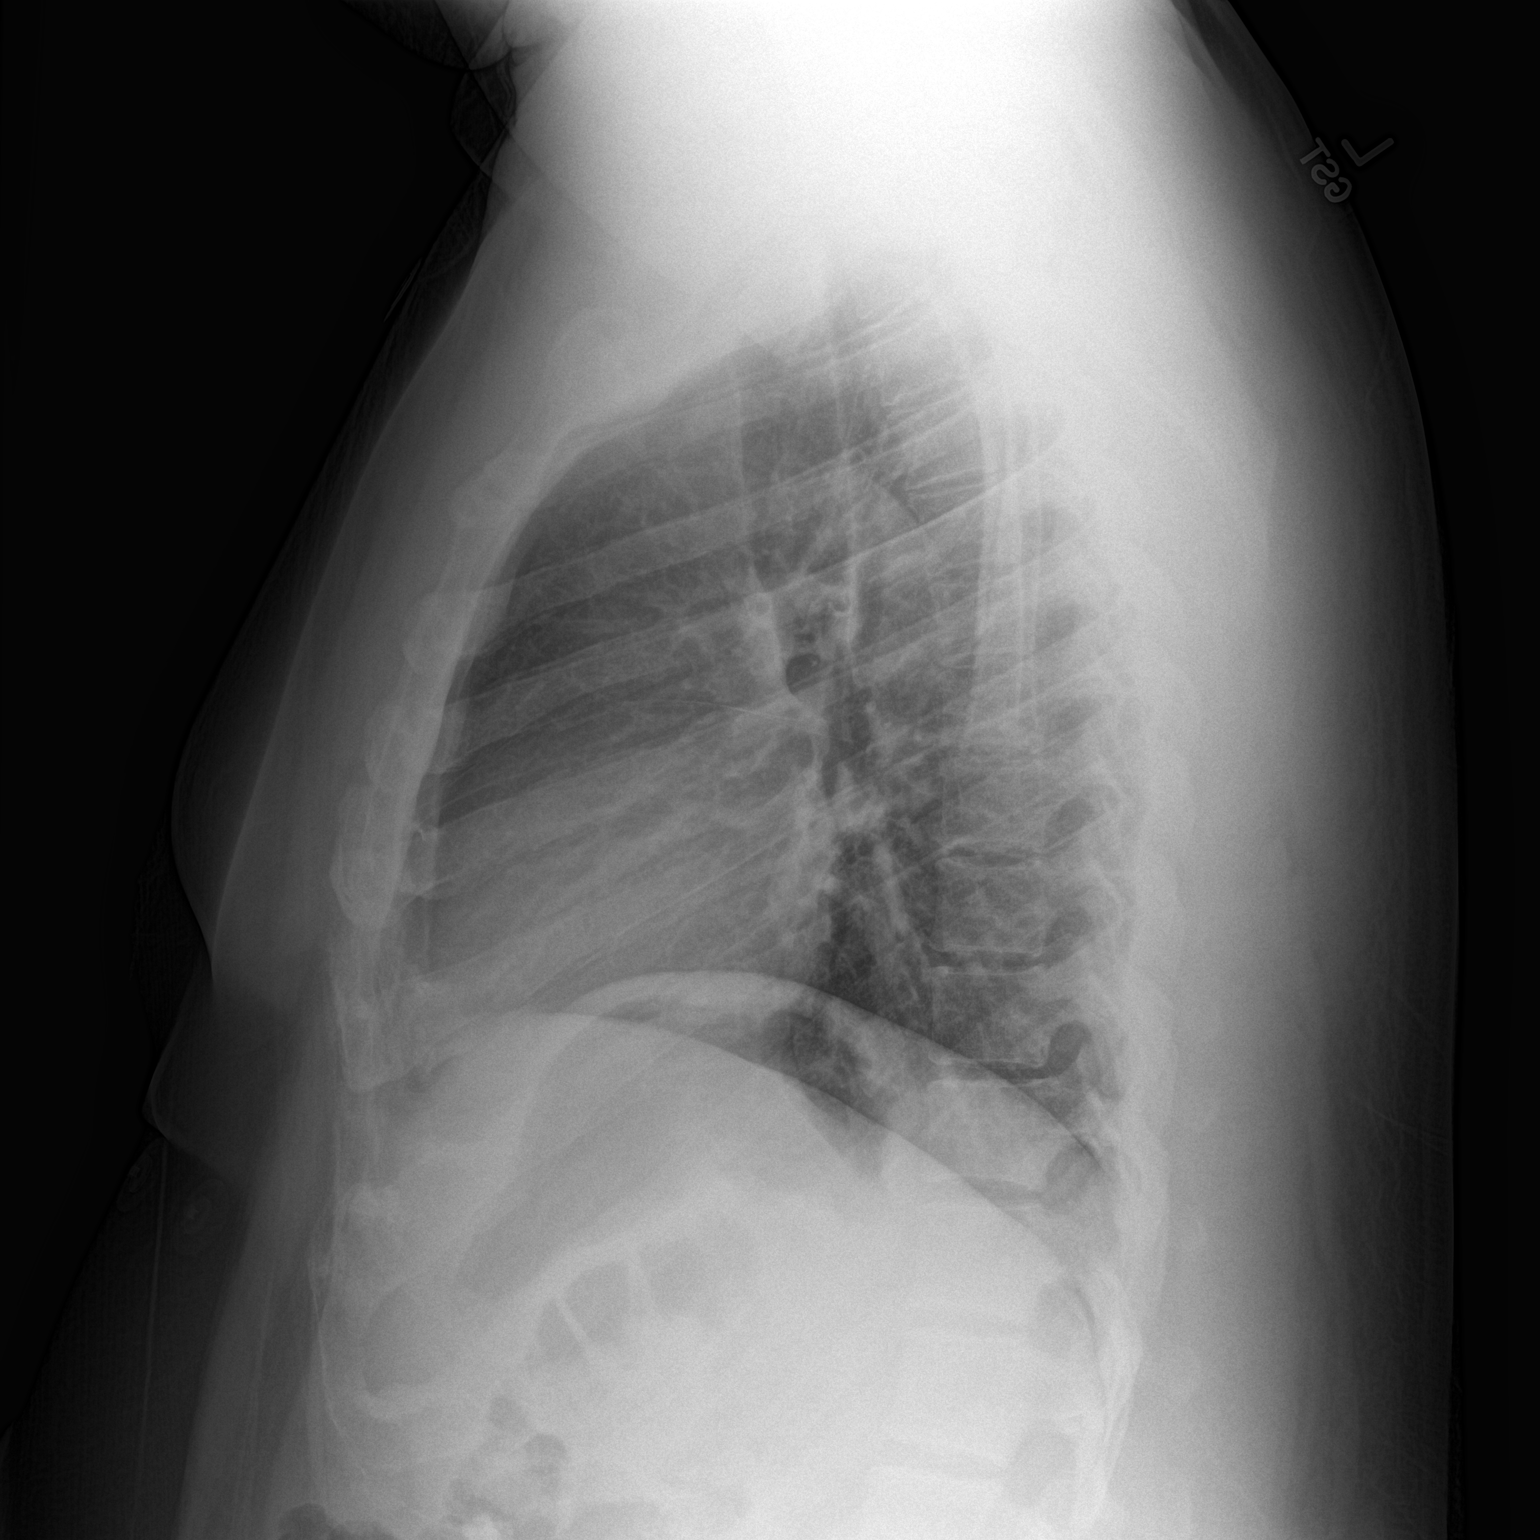

[2 of 2 positions shown; findings below may reference images not displayed]

FINDINGS: The heart size and mediastinal contours are within normal limits.
Both lungs are clear. The visualized skeletal structures are
unremarkable.
IMPRESSION: No active cardiopulmonary disease.

## 2020-01-15 ENCOUNTER — Encounter (HOSPITAL_COMMUNITY): Payer: Self-pay | Admitting: Emergency Medicine

## 2020-01-15 ENCOUNTER — Emergency Department (HOSPITAL_COMMUNITY)
Admission: EM | Admit: 2020-01-15 | Discharge: 2020-01-16 | Disposition: A | Discharge status: Home / Self Care | Payer: Medicaid - Out of State | Attending: Emergency Medicine | Admitting: Emergency Medicine

## 2020-01-15 ENCOUNTER — Other Ambulatory Visit: Payer: Self-pay

## 2020-01-15 DIAGNOSIS — E860 Dehydration: Secondary | ICD-10-CM | POA: Insufficient documentation

## 2020-01-15 DIAGNOSIS — R0789 Other chest pain: Secondary | ICD-10-CM | POA: Insufficient documentation

## 2020-01-15 DIAGNOSIS — R002 Palpitations: Secondary | ICD-10-CM | POA: Insufficient documentation

## 2020-01-15 DIAGNOSIS — F1210 Cannabis abuse, uncomplicated: Secondary | ICD-10-CM | POA: Insufficient documentation

## 2020-01-15 DIAGNOSIS — F419 Anxiety disorder, unspecified: Secondary | ICD-10-CM | POA: Insufficient documentation

## 2020-01-15 DIAGNOSIS — R000 Tachycardia, unspecified: Secondary | ICD-10-CM | POA: Insufficient documentation

## 2020-01-15 MED ORDER — LORAZEPAM 1 MG PO TABS
2.0000 mg | ORAL_TABLET | Freq: Once | ORAL | Status: AC
  Administered 2020-01-16: 2 mg via ORAL
  Filled 2020-01-15: qty 2

## 2020-01-15 NOTE — ED Triage Notes (Signed)
Pt arriving via [facility] for anxiety. Pt reports taking an edible yesterday and has increased anxiety. Pt stated to EMS "I always get really bad anxiety after I use marijuana." Pt A&O x4 and ambulatory upon arrival.

## 2020-01-16 NOTE — ED Provider Notes (Signed)
[facility]-EMERGENCY DEPT Provider Note   CSN: [id number] Arrival date & time: 01/15/20  2343     History Chief Complaint  Patient presents with  . Ingestion    Took edible yesterday....currently very anxious     Eugene Oliver is a 34 y.o. male.  Patient in ED with anxiousness, chest tightness, palpitations that started last night (01/15/20) after eating a single gummy edible. He does not know what strength he took. He reports similar symptoms with edibles in the past. History of anxiety which he states feels the same. No chest pain, only tightness. No vomiting, difficulty breathing.   The history is provided by the patient. No language interpreter was used.  Ingestion Pertinent negatives include no chest pain.       Past Medical History:  Diagnosis Date  . Anxiety   . Sinusitis     There are no problems to display for this patient.   History reviewed. No pertinent surgical history.     History reviewed. No pertinent family history.  Social History   Tobacco Use  . Smoking status: Never Smoker  Substance Use Topics  . Alcohol use: Yes    Alcohol/week: 21.0 standard drinks    Types: 21 Cans of beer per week    Comment: per week  . Drug use: No    Comment: rarely    Home Medications Prior to Admission medications   Medication Sig Start Date End Date Taking? Authorizing Provider  albuterol (PROVENTIL HFA;VENTOLIN HFA) 108 (90 BASE) MCG/ACT inhaler Inhale 1-2 puffs into the lungs every 6 (six) hours as needed for wheezing. 05/22/12   Sunnie Nielsen, MD  amoxicillin (AMOXIL) 500 MG capsule Take 2 capsules (1,000 mg total) by mouth 2 (two) times daily. 06/06/14   Elpidio Anis, PA-C  ibuprofen (ADVIL,MOTRIN) 800 MG tablet Take 1 tablet (800 mg total) by mouth 3 (three) times daily. 06/06/14   Elpidio Anis, PA-C  Pseudoephedrine-Naproxen Na (ALEVE SINUS & HEADACHE) 120-220 MG TB12 Take 1 tablet by mouth every 6 (six) hours as needed (sinus).     [provider]    Allergies    Patient has no known allergies.  Review of Systems   Review of Systems  Constitutional: Negative for chills and fever.  HENT: Negative.   Respiratory: Positive for chest tightness.   Cardiovascular: Negative.  Negative for chest pain.  Gastrointestinal: Negative.   Musculoskeletal: Negative.   Skin: Negative.   Neurological: Negative.   Psychiatric/Behavioral: The patient is nervous/anxious.     Physical Exam Updated Vital Signs BP 93/79   Pulse (!) 138   Temp 98.1 F (36.7 C) (Oral)   Resp (!) 21   Ht 5\' 10"  (1.778 m)   Wt 121.6 kg   SpO2 95%   BMI 38.45 kg/m   Physical Exam Constitutional:      Appearance: He is well-developed and well-nourished.  HENT:     Head: Normocephalic.  Cardiovascular:     Rate and Rhythm: Regular rhythm. Tachycardia present.     Heart sounds: No murmur heard.   Pulmonary:     Effort: Pulmonary effort is normal.     Breath sounds: Normal breath sounds. No wheezing, rhonchi or rales.  Abdominal:     General: Bowel sounds are normal.     Palpations: Abdomen is soft.     Tenderness: There is no abdominal tenderness. There is no guarding or rebound.  Musculoskeletal:        General: Normal range of motion.  Cervical back: Normal range of motion and neck supple.  Skin:    General: Skin is warm and dry.     Findings: No rash.  Neurological:     Mental Status: He is alert.     Cranial Nerves: No cranial nerve deficit.  Psychiatric:        Mood and Affect: Mood is anxious.        Speech: Speech is rapid and pressured.        Behavior: Behavior is not agitated. Behavior is cooperative.     Comments: Restless, pacing, does not want to sit down.      ED Results / Procedures / Treatments   Labs (all labs ordered are listed, but only abnormal results are displayed) Labs Reviewed  RAPID URINE DRUG SCREEN, HOSP PERFORMED    EKG None  Radiology No results  found.  Procedures Procedures (including critical care time)  Medications Ordered in ED Medications  LORazepam (ATIVAN) tablet 2 mg (2 mg Oral Given 01/16/20 0002)    ED Course  I have reviewed the triage vital signs and the nursing notes.  Pertinent labs & imaging results that were available during my care of the patient were reviewed by me and considered in my medical decision making (see chart for details).    MDM Rules/Calculators/A&P                          Patient to ED with ss/sxs as detailed in HPI. He is very anxious but shows no sign of paranoia.   He is significantly tachycardic. EKG shows sinus tach. Ativan PO provided 2 mg. UDS ordered. Will continue to observe.   Patient drinking water, several bottles, with improvement in his heart rate. Over time he is able to lie down and rest. Discussed symptoms and his use of edibles. Discussed having a PCP to follow up with. Encouraged hydration, PCP follow up. He is considered stable for discharge home.    Final Clinical Impression(s) / ED Diagnoses Final diagnoses:  None   1. Anxiety 2. Dehydration 3. Marijuana abuse   Rx / DC Orders ED Discharge Orders    None       Elpidio Anis, PA-C 01/16/20 0345    Sabas Sous, MD 01/16/20 [phone]

## 2020-01-16 NOTE — Discharge Instructions (Addendum)
You can be discharged home. You may want to avoid use of marijuana or marijuana products in the future.   It is important for you to continue to drink lots of water. If you develop new or worsening symptoms of concern, return to the emergency department for further evaluation.   Recommend getting a primary care provider.
